# Patient Record
Sex: Female | Born: 1960 | Race: Black or African American | Hispanic: No | Marital: Married | Smoking: Former smoker
Health system: Southern US, Community
[De-identification: ages and names within clinical notes are randomized; demographics above are authoritative.]

## PROBLEM LIST (undated history)

## (undated) DIAGNOSIS — E119 Type 2 diabetes mellitus without complications: Secondary | ICD-10-CM

## (undated) DIAGNOSIS — R0600 Dyspnea, unspecified: Secondary | ICD-10-CM

## (undated) DIAGNOSIS — S86019A Strain of unspecified Achilles tendon, initial encounter: Secondary | ICD-10-CM

## (undated) DIAGNOSIS — J302 Other seasonal allergic rhinitis: Secondary | ICD-10-CM

## (undated) DIAGNOSIS — I10 Essential (primary) hypertension: Secondary | ICD-10-CM

## (undated) DIAGNOSIS — J069 Acute upper respiratory infection, unspecified: Secondary | ICD-10-CM

## (undated) DIAGNOSIS — K219 Gastro-esophageal reflux disease without esophagitis: Secondary | ICD-10-CM

## (undated) HISTORY — DX: Type 2 diabetes mellitus without complications: E11.9

## (undated) HISTORY — PX: WISDOM TOOTH EXTRACTION: SHX21

## (undated) HISTORY — PX: NASAL SEPTUM SURGERY: SHX37

## (undated) HISTORY — PX: FOOT SURGERY: SHX648

## (undated) HISTORY — DX: Acute upper respiratory infection, unspecified: J06.9

---

## 1988-01-09 HISTORY — PX: GANGLION CYST EXCISION: SHX1691

## 1997-06-08 ENCOUNTER — Ambulatory Visit (HOSPITAL_COMMUNITY): Admission: RE | Admit: 1997-06-08 | Discharge: 1997-06-08 | Payer: Self-pay | Admitting: Gastroenterology

## 2002-02-25 ENCOUNTER — Other Ambulatory Visit: Admission: RE | Admit: 2002-02-25 | Discharge: 2002-02-25 | Payer: Self-pay | Admitting: Obstetrics and Gynecology

## 2002-08-20 ENCOUNTER — Other Ambulatory Visit: Admission: RE | Admit: 2002-08-20 | Discharge: 2002-08-20 | Payer: Self-pay | Admitting: Obstetrics and Gynecology

## 2002-09-10 ENCOUNTER — Inpatient Hospital Stay (HOSPITAL_COMMUNITY): Admission: AD | Admit: 2002-09-10 | Discharge: 2002-09-10 | Payer: Self-pay | Admitting: Obstetrics and Gynecology

## 2005-11-07 ENCOUNTER — Encounter: Admission: RE | Admit: 2005-11-07 | Discharge: 2005-11-07 | Payer: Self-pay | Admitting: Obstetrics and Gynecology

## 2006-11-12 ENCOUNTER — Encounter: Admission: RE | Admit: 2006-11-12 | Discharge: 2006-11-12 | Payer: Self-pay | Admitting: Family Medicine

## 2007-05-29 ENCOUNTER — Encounter: Admission: RE | Admit: 2007-05-29 | Discharge: 2007-05-29 | Payer: Self-pay | Admitting: Gastroenterology

## 2008-01-16 ENCOUNTER — Encounter: Admission: RE | Admit: 2008-01-16 | Discharge: 2008-01-16 | Payer: Self-pay | Admitting: Obstetrics and Gynecology

## 2008-01-23 ENCOUNTER — Encounter: Admission: RE | Admit: 2008-01-23 | Discharge: 2008-01-23 | Payer: Self-pay | Admitting: Obstetrics and Gynecology

## 2009-01-25 ENCOUNTER — Encounter: Admission: RE | Admit: 2009-01-25 | Discharge: 2009-01-25 | Payer: Self-pay | Admitting: Family Medicine

## 2010-01-29 ENCOUNTER — Encounter: Payer: Self-pay | Admitting: Family Medicine

## 2010-01-29 ENCOUNTER — Encounter: Payer: Self-pay | Admitting: Gastroenterology

## 2010-01-29 ENCOUNTER — Encounter: Payer: Self-pay | Admitting: Obstetrics and Gynecology

## 2010-01-31 ENCOUNTER — Encounter
Admission: RE | Admit: 2010-01-31 | Discharge: 2010-01-31 | Payer: Self-pay | Source: Home / Self Care | Attending: Family Medicine | Admitting: Family Medicine

## 2011-01-11 ENCOUNTER — Other Ambulatory Visit: Payer: Self-pay | Admitting: Family Medicine

## 2011-01-11 DIAGNOSIS — Z1231 Encounter for screening mammogram for malignant neoplasm of breast: Secondary | ICD-10-CM

## 2011-02-02 ENCOUNTER — Ambulatory Visit
Admission: RE | Admit: 2011-02-02 | Discharge: 2011-02-02 | Disposition: A | Discharge status: Home / Self Care | Payer: 59 | Source: Ambulatory Visit | Attending: Family Medicine | Admitting: Family Medicine

## 2011-02-02 DIAGNOSIS — Z1231 Encounter for screening mammogram for malignant neoplasm of breast: Secondary | ICD-10-CM

## 2011-05-09 DIAGNOSIS — S86019A Strain of unspecified Achilles tendon, initial encounter: Secondary | ICD-10-CM

## 2011-05-09 HISTORY — DX: Strain of unspecified achilles tendon, initial encounter: S86.019A

## 2011-05-21 ENCOUNTER — Other Ambulatory Visit: Payer: Self-pay | Admitting: Orthopedic Surgery

## 2011-05-21 ENCOUNTER — Encounter (HOSPITAL_BASED_OUTPATIENT_CLINIC_OR_DEPARTMENT_OTHER): Payer: Self-pay | Admitting: *Deleted

## 2011-05-23 ENCOUNTER — Encounter (HOSPITAL_BASED_OUTPATIENT_CLINIC_OR_DEPARTMENT_OTHER): Payer: Self-pay | Admitting: Anesthesiology

## 2011-05-23 ENCOUNTER — Encounter (HOSPITAL_BASED_OUTPATIENT_CLINIC_OR_DEPARTMENT_OTHER)
Admission: RE | Disposition: A | Discharge status: Home / Self Care | Payer: Self-pay | Source: Ambulatory Visit | Attending: Orthopedic Surgery

## 2011-05-23 ENCOUNTER — Encounter (HOSPITAL_BASED_OUTPATIENT_CLINIC_OR_DEPARTMENT_OTHER): Payer: Self-pay | Admitting: *Deleted

## 2011-05-23 ENCOUNTER — Ambulatory Visit (HOSPITAL_BASED_OUTPATIENT_CLINIC_OR_DEPARTMENT_OTHER): Payer: BC Managed Care – PPO | Admitting: Anesthesiology

## 2011-05-23 ENCOUNTER — Ambulatory Visit (HOSPITAL_BASED_OUTPATIENT_CLINIC_OR_DEPARTMENT_OTHER)
Admission: RE | Admit: 2011-05-23 | Discharge: 2011-05-23 | Disposition: A | Discharge status: Home / Self Care | Payer: BC Managed Care – PPO | Source: Ambulatory Visit | Attending: Orthopedic Surgery | Admitting: Orthopedic Surgery

## 2011-05-23 DIAGNOSIS — S93499A Sprain of other ligament of unspecified ankle, initial encounter: Secondary | ICD-10-CM | POA: Insufficient documentation

## 2011-05-23 DIAGNOSIS — W010XXA Fall on same level from slipping, tripping and stumbling without subsequent striking against object, initial encounter: Secondary | ICD-10-CM | POA: Insufficient documentation

## 2011-05-23 DIAGNOSIS — K219 Gastro-esophageal reflux disease without esophagitis: Secondary | ICD-10-CM | POA: Insufficient documentation

## 2011-05-23 HISTORY — DX: Gastro-esophageal reflux disease without esophagitis: K21.9

## 2011-05-23 HISTORY — PX: ACHILLES TENDON SURGERY: SHX542

## 2011-05-23 HISTORY — DX: Other seasonal allergic rhinitis: J30.2

## 2011-05-23 HISTORY — DX: Strain of unspecified achilles tendon, initial encounter: S86.019A

## 2011-05-23 SURGERY — REPAIR, TENDON, ACHILLES
Anesthesia: General | Site: Ankle | Laterality: Left | Wound class: Clean

## 2011-05-23 MED ORDER — MIDAZOLAM HCL 5 MG/5ML IJ SOLN
INTRAMUSCULAR | Status: DC | PRN
  Administered 2011-05-23: 2 mg via INTRAVENOUS

## 2011-05-23 MED ORDER — MORPHINE SULFATE 4 MG/ML IJ SOLN: 0.0500 mg/kg | INTRAMUSCULAR | Status: DC | PRN

## 2011-05-23 MED ORDER — POVIDONE-IODINE 7.5 % EX SOLN: Freq: Once | CUTANEOUS | Status: DC

## 2011-05-23 MED ORDER — PHENYLEPHRINE HCL 10 MG/ML IJ SOLN
INTRAMUSCULAR | Status: DC | PRN
  Administered 2011-05-23: 40 ug via INTRAVENOUS

## 2011-05-23 MED ORDER — CEFAZOLIN SODIUM-DEXTROSE 2-3 GM-% IV SOLR
2.0000 g | INTRAVENOUS | Status: AC
  Administered 2011-05-23: 2 g via INTRAVENOUS

## 2011-05-23 MED ORDER — SUCCINYLCHOLINE CHLORIDE 20 MG/ML IJ SOLN
INTRAMUSCULAR | Status: DC | PRN
  Administered 2011-05-23: 100 mg via INTRAVENOUS

## 2011-05-23 MED ORDER — HYDROMORPHONE HCL PF 1 MG/ML IJ SOLN: 0.2500 mg | INTRAMUSCULAR | Status: DC | PRN

## 2011-05-23 MED ORDER — BUPIVACAINE HCL (PF) 0.5 % IJ SOLN
INTRAMUSCULAR | Status: DC | PRN
  Administered 2011-05-23: 20 mL

## 2011-05-23 MED ORDER — ONDANSETRON HCL 4 MG/2ML IJ SOLN: 4.0000 mg | Freq: Once | INTRAMUSCULAR | Status: DC | PRN

## 2011-05-23 MED ORDER — ACETAMINOPHEN 10 MG/ML IV SOLN
INTRAVENOUS | Status: DC | PRN
  Administered 2011-05-23: 1000 mg via INTRAVENOUS

## 2011-05-23 MED ORDER — PROPOFOL 10 MG/ML IV EMUL
INTRAVENOUS | Status: DC | PRN
  Administered 2011-05-23: 150 mg via INTRAVENOUS

## 2011-05-23 MED ORDER — LIDOCAINE HCL (CARDIAC) 20 MG/ML IV SOLN
INTRAVENOUS | Status: DC | PRN
  Administered 2011-05-23: 100 mg via INTRAVENOUS

## 2011-05-23 MED ORDER — CEFAZOLIN SODIUM 1-5 GM-% IV SOLN
1.0000 g | Freq: Once | INTRAVENOUS | Status: AC
  Administered 2011-05-23: 1 g via INTRAVENOUS

## 2011-05-23 MED ORDER — FENTANYL CITRATE 0.05 MG/ML IJ SOLN
INTRAMUSCULAR | Status: DC | PRN
  Administered 2011-05-23: 100 ug via INTRAVENOUS
  Administered 2011-05-23: 50 ug via INTRAVENOUS

## 2011-05-23 MED ORDER — LACTATED RINGERS IV SOLN
INTRAVENOUS | Status: DC
  Administered 2011-05-23: 09:00:00 via INTRAVENOUS

## 2011-05-23 SURGICAL SUPPLY — 68 items
BANDAGE ELASTIC 4 VELCRO ST LF (GAUZE/BANDAGES/DRESSINGS) ×2 IMPLANT
BANDAGE ELASTIC 6 VELCRO ST LF (GAUZE/BANDAGES/DRESSINGS) ×2 IMPLANT
BANDAGE ESMARK 6X9 LF (GAUZE/BANDAGES/DRESSINGS) ×1 IMPLANT
BENZOIN TINCTURE PRP APPL 2/3 (GAUZE/BANDAGES/DRESSINGS) ×2 IMPLANT
BLADE SURG 10 STRL SS (BLADE) ×2 IMPLANT
BLADE SURG 15 STRL LF DISP TIS (BLADE) ×1 IMPLANT
BLADE SURG 15 STRL SS (BLADE) ×1
BNDG ESMARK 4X9 LF (GAUZE/BANDAGES/DRESSINGS) IMPLANT
BNDG ESMARK 6X9 LF (GAUZE/BANDAGES/DRESSINGS) ×2
CLOTH BEACON ORANGE TIMEOUT ST (SAFETY) ×2 IMPLANT
COVER TABLE BACK 60X90 (DRAPES) ×2 IMPLANT
DECANTER SPIKE VIAL GLASS SM (MISCELLANEOUS) IMPLANT
DRAPE EXTREMITY T 121X128X90 (DRAPE) ×2 IMPLANT
DRAPE U 20/CS (DRAPES) ×2 IMPLANT
DRAPE U-SHAPE 47X51 STRL (DRAPES) ×2 IMPLANT
DRSG EMULSION OIL 3X3 NADH (GAUZE/BANDAGES/DRESSINGS) ×2 IMPLANT
DURAPREP 26ML APPLICATOR (WOUND CARE) ×2 IMPLANT
ELECT REM PT RETURN 9FT ADLT (ELECTROSURGICAL) ×2
ELECTRODE REM PT RTRN 9FT ADLT (ELECTROSURGICAL) ×1 IMPLANT
GAUZE XEROFORM 1X8 LF (GAUZE/BANDAGES/DRESSINGS) ×2 IMPLANT
GLOVE BIO SURGEON STRL SZ 6.5 (GLOVE) ×2 IMPLANT
GLOVE BIOGEL PI IND STRL 8 (GLOVE) ×2 IMPLANT
GLOVE BIOGEL PI INDICATOR 8 (GLOVE) ×2
GLOVE ECLIPSE 7.5 STRL STRAW (GLOVE) ×4 IMPLANT
GLOVE INDICATOR 7.0 STRL GRN (GLOVE) ×2 IMPLANT
GOWN BRE IMP PREV XXLGXLNG (GOWN DISPOSABLE) ×2 IMPLANT
GOWN PREVENTION PLUS XLARGE (GOWN DISPOSABLE) ×2 IMPLANT
GOWN PREVENTION PLUS XXLARGE (GOWN DISPOSABLE) ×2 IMPLANT
NDL SUT 6 .5 CRC .975X.05 MAYO (NEEDLE) ×1 IMPLANT
NEEDLE 22X1 1/2 (OR ONLY) (NEEDLE) ×2 IMPLANT
NEEDLE HYPO 25X1 1.5 SAFETY (NEEDLE) IMPLANT
NEEDLE MAYO TAPER (NEEDLE) ×2
NS IRRIG 1000ML POUR BTL (IV SOLUTION) ×2 IMPLANT
PAD CAST 4YDX4 CTTN HI CHSV (CAST SUPPLIES) ×2 IMPLANT
PADDING CAST ABS 4INX4YD NS (CAST SUPPLIES) ×1
PADDING CAST ABS COTTON 4X4 ST (CAST SUPPLIES) ×1 IMPLANT
PADDING CAST COTTON 4X4 STRL (CAST SUPPLIES) ×2
PASSER SUT SWANSON 36MM LOOP (INSTRUMENTS) IMPLANT
PENCIL BUTTON HOLSTER BLD 10FT (ELECTRODE) ×2 IMPLANT
SPLINT FAST PLASTER 5X30 (CAST SUPPLIES) ×2
SPLINT PLASTER CAST FAST 5X30 (CAST SUPPLIES) ×2 IMPLANT
SPONGE GAUZE 4X4 12PLY (GAUZE/BANDAGES/DRESSINGS) ×2 IMPLANT
SPONGE LAP 4X18 X RAY DECT (DISPOSABLE) ×2 IMPLANT
STOCKINETTE 6  STRL (DRAPES) ×1
STOCKINETTE 6 STRL (DRAPES) ×1 IMPLANT
SUCTION FRAZIER TIP 10 FR DISP (SUCTIONS) IMPLANT
SUT ETHIBOND 0 MO6 C/R (SUTURE) IMPLANT
SUT ETHIBOND 2 OS 4 DA (SUTURE) IMPLANT
SUT ETHILON 3 0 PS 1 (SUTURE) ×2 IMPLANT
SUT FIBERWIRE #2 38 T-5 BLUE (SUTURE) ×4
SUT FIBERWIRE #5 38 CONV NDL (SUTURE)
SUT MNCRL AB 3-0 PS2 18 (SUTURE) ×2 IMPLANT
SUT VIC AB 2-0 CT1 27 (SUTURE) ×2
SUT VIC AB 2-0 CT1 TAPERPNT 27 (SUTURE) ×1 IMPLANT
SUT VIC AB 3-0 FS2 27 (SUTURE) IMPLANT
SUT VIC AB 4-0 SH 27 (SUTURE) ×1
SUT VIC AB 4-0 SH 27XANBCTRL (SUTURE) ×1 IMPLANT
SUTURE FIBERWR #2 38 T-5 BLUE (SUTURE) ×2 IMPLANT
SUTURE FIBERWR #5 38 CONV NDL (SUTURE) IMPLANT
SYR BULB 3OZ (MISCELLANEOUS) ×2 IMPLANT
SYR CONTROL 10ML LL (SYRINGE) ×2 IMPLANT
TAPE STRIPS DRAPE STRL (GAUZE/BANDAGES/DRESSINGS) ×2 IMPLANT
TOWEL OR 17X24 6PK STRL BLUE (TOWEL DISPOSABLE) ×4 IMPLANT
TOWEL OR NON WOVEN STRL DISP B (DISPOSABLE) ×2 IMPLANT
TUBE CONNECTING 20X1/4 (TUBING) ×2 IMPLANT
UNDERPAD 30X30 INCONTINENT (UNDERPADS AND DIAPERS) ×2 IMPLANT
WATER STERILE IRR 1000ML POUR (IV SOLUTION) IMPLANT
YANKAUER SUCT BULB TIP NO VENT (SUCTIONS) ×2 IMPLANT

## 2011-05-23 NOTE — Anesthesia Preprocedure Evaluation (Addendum)
Anesthesia Evaluation  Patient identified by MRN, date of birth, ID band Patient awake    Reviewed: Allergy & Precautions, H&P , NPO status , Patient's Chart, lab work & pertinent test results  Airway Mallampati: I TM Distance: >3 FB Neck ROM: Full    Dental   Pulmonary          Cardiovascular     Neuro/Psych  Neuromuscular disease    GI/Hepatic GERD-  Medicated and Controlled,  Endo/Other    Renal/GU      Musculoskeletal   Abdominal   Peds  Hematology   Anesthesia Other Findings   Reproductive/Obstetrics                           Anesthesia Physical Anesthesia Plan  ASA: II  Anesthesia Plan: General   Post-op Pain Management:    Induction: Intravenous  Airway Management Planned: Oral ETT  Additional Equipment:   Intra-op Plan:   Post-operative Plan: Extubation in OR  Informed Consent: I have reviewed the patients History and Physical, chart, labs and discussed the procedure including the risks, benefits and alternatives for the proposed anesthesia with the patient or authorized representative who has indicated his/her understanding and acceptance.     Plan Discussed with: CRNA and Surgeon  Anesthesia Plan Comments:        Anesthesia Quick Evaluation

## 2011-05-23 NOTE — Brief Op Note (Signed)
05/23/2011  3:51 PM  PATIENT:  Shelley Anderson  51 y.o. female  PRE-OPERATIVE DIAGNOSIS:  achilles tendon rupture on left  POST-OPERATIVE DIAGNOSIS:  achilles tendon rupture on left  PROCEDURE:  Procedure(s) (LRB): ACHILLES TENDON REPAIR (Left)  SURGEON:  Surgeon(s) and Role:    * Harvie Junior, MD - Primary  PHYSICIAN ASSISTANT:   ASSISTANTS: bethune   ANESTHESIA:   general  EBL:  Total I/O In: 462 [P.O.:462] Out: -   BLOOD ADMINISTERED:none  DRAINS: none   LOCAL MEDICATIONS USED:  MARCAINE     SPECIMEN:  No Specimen  DISPOSITION OF SPECIMEN:  N/A  COUNTS:  YES  TOURNIQUET:   Total Tourniquet Time Documented: Thigh (Left) - 56 minutes  DICTATION: .Other Dictation: Dictation Number [id number]  PLAN OF CARE: Discharge to home after PACU  PATIENT DISPOSITION:  PACU - hemodynamically stable.   Delay start of Pharmacological VTE agent (>24hrs) due to surgical blood loss or risk of bleeding: not applicable

## 2011-05-23 NOTE — Anesthesia Procedure Notes (Signed)
Procedure Name: Intubation Date/Time: 05/23/2011 10:14 AM Performed by: Zenia Resides D Pre-anesthesia Checklist: Patient identified, Emergency Drugs available, Suction available, Patient being monitored and Timeout performed Patient Re-evaluated:Patient Re-evaluated prior to inductionOxygen Delivery Method: Circle System Utilized Preoxygenation: Pre-oxygenation with 100% oxygen Intubation Type: IV induction Ventilation: Mask ventilation without difficulty Grade View: Grade II Tube type: Oral Tube size: 7.0 mm Number of attempts: 1 Airway Equipment and Method: stylet and oral airway Placement Confirmation: ETT inserted through vocal cords under direct vision,  positive ETCO2 and breath sounds checked- equal and bilateral Secured at: 23 cm Tube secured with: Tape Dental Injury: Teeth and Oropharynx as per pre-operative assessment

## 2011-05-23 NOTE — Transfer of Care (Signed)
Immediate Anesthesia Transfer of Care Note  Patient: Shelley Anderson  Procedure(s) Performed: Procedure(s) (LRB): ACHILLES TENDON REPAIR (Left)  Patient Location: PACU  Anesthesia Type: General and Regional  Level of Consciousness: awake, alert  and oriented  Airway & Oxygen Therapy: Patient Spontanous Breathing and Patient connected to face mask oxygen  Post-op Assessment: Report given to PACU RN and Post -op Vital signs reviewed and stable  Post vital signs: Reviewed and stable  Complications: No apparent anesthesia complications

## 2011-05-23 NOTE — H&P (Signed)
PREOPERATIVE H&P  Chief Complaint: l achilles rupture  HPI: Shelley Anderson is a 51 y.o. female who presents for evaluation of l achilles rupture. It has been present for 10 days and has been worsening. She has failed conservative measures. Pain is rated as moderate.  Past Medical History  Diagnosis Date  . Acid reflux     OTC as needed  . Achilles tendon rupture 05/2011    left  . Seasonal allergies    Past Surgical History  Procedure Date  . Nasal septum surgery > 20 yrs. ago  . Foot surgery 20 yrs. ago    to remove bone spurs left foot  . Ganglion cyst excision 1990    right wrist   History   Social History  . Marital Status: Married    Spouse Name: N/A    Number of Children: N/A  . Years of Education: N/A   Social History Main Topics  . Smoking status: Never Smoker   . Smokeless tobacco: Never Used  . Alcohol Use: Yes     rarely  . Drug Use: No  . Sexually Active:    Other Topics Concern  . None   Social History Narrative  . None   History reviewed. No pertinent family history. Allergies  Allergen Reactions  . Aspirin Nausea And Vomiting  . Lactose Intolerance (Gi) Other (See Comments)    GI UPSET   Prior to Admission medications   Medication Sig Start Date End Date Taking? Authorizing Provider  azelastine (ASTELIN) 137 MCG/SPRAY nasal spray Place 1 spray into the nose 2 (two) times daily. Use in each nostril as directed   Yes Historical Provider, MD  calcium citrate-vitamin D (CITRACAL+D) 315-200 MG-UNIT per tablet Take 1 tablet by mouth 2 (two) times daily.   Yes Historical Provider, MD  famotidine (PEPCID AC) 10 MG chewable tablet Chew 10 mg by mouth as needed.    Yes Historical Provider, MD  fexofenadine-pseudoephedrine (ALLEGRA-D 24) 180-240 MG per 24 hr tablet Take 1 tablet by mouth daily.   Yes Historical Provider, MD  Multiple Vitamin (MULTIVITAMIN) tablet Take 1 tablet by mouth daily.   Yes Historical Provider, MD  vitamin C (ASCORBIC ACID) 500 MG  tablet Take 500 mg by mouth daily.   Yes Historical Provider, MD     Positive ROS: none  All other systems have been reviewed and were otherwise negative with the exception of those mentioned in the HPI and as above.  Physical Exam: There were no vitals filed for this visit.  General: Alert, no acute distress Cardiovascular: No pedal edema Respiratory: No cyanosis, no use of accessory musculature GI: No organomegaly, abdomen is soft and non-tender Skin: No lesions in the area of chief complaint Neurologic: Sensation intact distally Psychiatric: Patient is competent for consent with normal mood and affect Lymphatic: No axillary or cervical lymphadenopathy  MUSCULOSKELETAL: l achilles -Thompson testy.  Palp defect in achilles  Assessment/Plan: achilles tendon rupture on left Plan for Procedure(s): ACHILLES TENDON REPAIR  The risks benefits and alternatives were discussed with the patient including but not limited to the risks of nonoperative treatment, versus surgical intervention including infection, bleeding, nerve injury, malunion, nonunion, hardware prominence, hardware failure, need for hardware removal, blood clots, cardiopulmonary complications, morbidity, mortality, among others, and they were willing to proceed.  Predicted outcome is good, although there will be at least a six to nine month expected recovery.  Luken Shadowens L, MD 05/23/2011 8:40 AM

## 2011-05-23 NOTE — Addendum Note (Signed)
Addendum  created 05/23/11 1404 by Johncarlos Holtsclaw D Rayvn Rickerson, CRNA   Modules edited:Anesthesia Events    

## 2011-05-23 NOTE — Discharge Instructions (Signed)

## 2011-05-23 NOTE — Progress Notes (Signed)
Pt. Discussed block with Dr. Michelle Piper and Dr. Luiz Blare and has decided not to have the block.

## 2011-05-23 NOTE — Addendum Note (Signed)
Addendum  created 05/23/11 1404 by Ronnette Hila, CRNA   Modules edited:Anesthesia Events

## 2011-05-23 NOTE — Anesthesia Postprocedure Evaluation (Signed)
Anesthesia Post Note  Patient: Office manager  Procedure(s) Performed: Procedure(s) (LRB): ACHILLES TENDON REPAIR (Left)  Anesthesia type: general  Patient location: PACU  Post pain: Pain level controlled  Post assessment: Patient's Cardiovascular Status Stable  Last Vitals:  Filed Vitals:   05/23/11 1200  BP: 128/79  Pulse:   Temp:   Resp:     Post vital signs: Reviewed and stable  Level of consciousness: sedated  Complications: No apparent anesthesia complications

## 2011-05-23 NOTE — Anesthesia Postprocedure Evaluation (Signed)
Anesthesia Post Note  Patient: Office manager  Procedure(s) Performed: Procedure(s) (LRB): ACHILLES TENDON REPAIR (Left)  Anesthesia type: general  Patient location: PACU  Post pain: Pain level controlled  Post assessment: Patient's Cardiovascular Status Stable  Last Vitals:  Filed Vitals:   05/23/11 1236  BP: 140/63  Pulse: 94  Temp:   Resp: 15    Post vital signs: Reviewed and stable  Level of consciousness: sedated  Complications: No apparent anesthesia complications

## 2011-05-24 NOTE — Op Note (Signed)
[name], DUNK NO.:  [id number]  MEDICAL RECORD NO.:  [id number]  LOCATION:                                 FACILITY:  PHYSICIAN:  Harvie Junior, M.D.        DATE OF BIRTH:  DATE OF PROCEDURE:  05/23/2011 DATE OF DISCHARGE:                              OPERATIVE REPORT   PREOPERATIVE DIAGNOSIS:  Achilles tendon rupture, left.  POSTOPERATIVE DIAGNOSIS:  Achilles tendon rupture, left.  PRINCIPAL PROCEDURE:  Left Achilles tendon repair with #2 FiberWire.  SURGEON:  Harvie Junior, MD  ASSISTANT:  Marshia Ly, PA  ANESTHESIA:  General.  BRIEF HISTORY:  Mrs. Shelley Anderson is a 51 year old female who had a slip and fall and basically ruptured Achilles tendon.  We evaluated and noted to have a negative Thompson test and the palpable defect.  A ultrasound was done in the office, which showed her tear and she was brought to the operating room for fixation.  PROCEDURE:  The patient was brought to the operating room.  After adequate anesthesia was induced with general anesthetic, the patient was placed supine and then rolled prone on the operating table.  Chest rolls were placed and all bony prominences were well padded.  Attention was then turned to the left leg where after routine prep and drape, the leg was exsanguinated, blood pressure tourniquet was inflated to 300 mmHg and following this, a medial incision was made, subcutaneous tissue down to the level of the tendon and the peritenon.  The peritenon was divided.  The tendon was then dissected free from the peritenon and then a Krackow stitch was passed along the proximal tendon border, Krackow stitch passed along the distal tendon border, this gave beautiful access to the tendon and got great pull on each side.  Set the stitches, it is very tight as tight as we could and then we tied them with these #2 FiberWire stitches, having brought the stitches out the bottom, so that the stitches were buried.   Once this was done, the tendon was tested, positive Janee Morn test is now normal.  The attention was then turned towards the peritenon closure after irrigating and suctioning dry the wound.  The peritenon was closed with 4-0 Vicryl running, the skin with 4-0 Vicryl and 3-0 nylon stitches.  A sterile compressive dressing was applied as well as a toes down plantar splint and the patient was taken to recovery room, she was noted to be in satisfactory condition.  Estimated blood loss for the procedure was none.     Harvie Junior, M.D.     Ranae Plumber  D:  05/23/2011  T:  05/24/2011  Job:  [id number]

## 2011-05-25 ENCOUNTER — Encounter (HOSPITAL_BASED_OUTPATIENT_CLINIC_OR_DEPARTMENT_OTHER): Payer: Self-pay | Admitting: Orthopedic Surgery

## 2012-01-08 ENCOUNTER — Other Ambulatory Visit: Payer: Self-pay | Admitting: Family Medicine

## 2012-01-08 DIAGNOSIS — Z1231 Encounter for screening mammogram for malignant neoplasm of breast: Secondary | ICD-10-CM

## 2012-02-14 ENCOUNTER — Ambulatory Visit
Admission: RE | Admit: 2012-02-14 | Discharge: 2012-02-14 | Disposition: A | Discharge status: Home / Self Care | Payer: BC Managed Care – PPO | Source: Ambulatory Visit | Attending: Family Medicine | Admitting: Family Medicine

## 2012-02-14 DIAGNOSIS — Z1231 Encounter for screening mammogram for malignant neoplasm of breast: Secondary | ICD-10-CM

## 2013-01-13 ENCOUNTER — Other Ambulatory Visit: Payer: Self-pay

## 2013-01-13 DIAGNOSIS — Z1231 Encounter for screening mammogram for malignant neoplasm of breast: Secondary | ICD-10-CM

## 2013-01-16 ENCOUNTER — Encounter: Payer: Self-pay | Admitting: Podiatrist

## 2013-01-16 ENCOUNTER — Ambulatory Visit (INDEPENDENT_AMBULATORY_CARE_PROVIDER_SITE_OTHER): Payer: 59 | Admitting: Podiatrist

## 2013-01-16 VITALS — BP 129/53 | HR 86 | Resp 18

## 2013-01-16 DIAGNOSIS — M216X9 Other acquired deformities of unspecified foot: Secondary | ICD-10-CM

## 2013-01-16 DIAGNOSIS — Q828 Other specified congenital malformations of skin: Secondary | ICD-10-CM

## 2013-01-16 NOTE — Progress Notes (Signed)
   Subjective:    Patient ID: Shelley Anderson, female    DOB: 1960/10/16, 53 y.o.   MRN: [id number]  HPI I need my calluses trimmed up on the ball of both feet and I am a diabetic    Review of Systems  Constitutional: Negative.   HENT: Negative.   Eyes: Negative.   Respiratory: Negative.   Cardiovascular: Negative.   Gastrointestinal: Negative.   Endocrine: Negative.   Genitourinary: Negative.   Musculoskeletal: Positive for back pain.  Skin: Negative.   Allergic/Immunologic:       Dairy   Neurological: Negative.   Hematological: Bruises/bleeds easily.  Psychiatric/Behavioral: Negative.        Objective:   Physical Exam GENERAL APPEARANCE: Alert, conversant. Appropriately groomed. No acute distress.  VASCULAR: Pedal pulses palpable and strong bilateral.  Capillary refill time is immediate to all digits,  Proximal to distal cooling it warm to warm.  Digital hair growth is present bilateral  NEUROLOGIC: sensation is intact epicritically and protectively to 5.07 monofilament at 5/5 sites bilateral.  Light touch is intact bilateral, vibratory sensation intact bilateral,  MUSCULOSKELETAL: acceptable muscle strength, tone and stability bilateral. Forefoot cavus present bilateral DERMATOLOGIC:caluses present plantar forefoot bilateral.  They are diffuse in nature and integument is intact.      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 Recommended soft supportive shoes.

## 2013-02-17 ENCOUNTER — Ambulatory Visit: Payer: BC Managed Care – PPO

## 2013-02-26 ENCOUNTER — Ambulatory Visit
Admission: RE | Admit: 2013-02-26 | Discharge: 2013-02-26 | Disposition: A | Discharge status: Home / Self Care | Payer: 59 | Source: Ambulatory Visit

## 2013-02-26 DIAGNOSIS — Z1231 Encounter for screening mammogram for malignant neoplasm of breast: Secondary | ICD-10-CM

## 2013-03-03 ENCOUNTER — Ambulatory Visit: Payer: BC Managed Care – PPO

## 2013-10-04 ENCOUNTER — Encounter: Payer: Self-pay | Admitting: *Deleted

## 2014-02-05 ENCOUNTER — Other Ambulatory Visit: Payer: Self-pay

## 2014-02-05 DIAGNOSIS — Z1231 Encounter for screening mammogram for malignant neoplasm of breast: Secondary | ICD-10-CM

## 2014-03-08 ENCOUNTER — Other Ambulatory Visit: Payer: Self-pay

## 2014-03-08 ENCOUNTER — Ambulatory Visit
Admission: RE | Admit: 2014-03-08 | Discharge: 2014-03-08 | Disposition: A | Discharge status: Home / Self Care | Payer: BC Managed Care – PPO | Source: Ambulatory Visit

## 2014-03-08 ENCOUNTER — Encounter (INDEPENDENT_AMBULATORY_CARE_PROVIDER_SITE_OTHER): Payer: Self-pay

## 2014-03-08 DIAGNOSIS — Z1231 Encounter for screening mammogram for malignant neoplasm of breast: Secondary | ICD-10-CM

## 2014-09-26 NOTE — Progress Notes (Signed)
Cardiology Office Note   Date:  09/27/2014   ID:  Shelley Anderson, DOB 12-Dec-1960, MRN [id number]  PCP:  Geraldo Pitter, MD  Cardiologist:   Madilyn Hook, MD   Chief Complaint  Patient presents with  . New Evaluation    pt states she has not had chest pain in weeks/ has hadi chest discomfort in the past 10 days  . Dizziness    pt thinks from allergies/in the last 4 weeks, when she sits up she gets dizzy      History of Present Illness: Shelley Anderson is a 54 y.o. female who presents for an evaluation of chest pain.  One month ago she had a 4-5 day episode of sharp pain under her L breast and L jaw pain.  The pain was intermittent and took her breath away.  It was non-exertional and not associated with nausea, shortness of breath, or diaphoresis. It improved after passing gas and was better with movement. It did not radiate. She took simethicone, which helped her symptoms. She felt that it might have been due to gas. Since that time, she has experienced one additional episode. Shelley Anderson exercises 4 times per week with cardio and weight lifting. She denies any chest pain or shortness of breath with this activity.  Shelley Anderson recently gained 35 pounds after losing both of her parents over the span of 8 months. As above, she has increased her exercise. She is also tried to improve her diet. She eats lots of vegetables and gr. She does not eat many white foods. Her main trouble is portion control. She has been watching her blood pressure and it has been slightly elevated when visiting her primary care physician. They have had an ongoing discussion about starting an antihypertensive though she has requested to wait as she knows her blood pressure will go down when she loses weight.    Past Medical History  Diagnosis Date  . Acid reflux     OTC as needed  . Achilles tendon rupture 05/2011    left  . Seasonal allergies   . Diabetes mellitus without complication     Past Surgical  History  Procedure Laterality Date  . Nasal septum surgery  > 20 yrs. ago  . Foot surgery  20 yrs. ago    to remove bone spurs left foot  . Ganglion cyst excision  1990    right wrist  . Achilles tendon surgery  05/23/2011    Procedure: ACHILLES TENDON REPAIR;  Surgeon: Harvie Junior, MD;  Location: [facility];  Service: Orthopedics;  Laterality: Left;     Current Outpatient Prescriptions  Medication Sig Dispense Refill  . budesonide-formoterol (SYMBICORT) 160-4.5 MCG/ACT inhaler Inhale 2 puffs into the lungs 2 (two) times daily as needed.    . calcium citrate-vitamin D (CITRACAL+D) 315-200 MG-UNIT per tablet Take 1 tablet by mouth 2 (two) times daily.    . famotidine (PEPCID AC) 10 MG chewable tablet Chew 10 mg by mouth as needed.     . fexofenadine-pseudoephedrine (ALLEGRA-D 24) 180-240 MG per 24 hr tablet Take 1 tablet by mouth daily.    . fluticasone (FLONASE) 50 MCG/ACT nasal spray Place 2 sprays into both nostrils daily.    . Multiple Vitamin (MULTIVITAMIN) tablet Take 1 tablet by mouth daily.    Marland Kitchen omeprazole (PRILOSEC) 40 MG capsule Take 40 mg by mouth daily.  12  . vitamin C (ASCORBIC ACID) 500 MG tablet Take 500 mg by mouth  daily.    . amLODipine (NORVASC) 2.5 MG tablet Take 1 tablet (2.5 mg total) by mouth daily. 90 tablet 3   No current facility-administered medications for this visit.    Allergies:   Aspirin and Lactose intolerance (gi)    Social History:  The patient  reports that she has never smoked. She has never used smokeless tobacco. She reports that she drinks alcohol. She reports that she does not use illicit drugs.   Family History:  The patient's family history includes Diabetes in her father; Kidney disease in her father; Muscular dystrophy in her mother.    ROS:  Please see the history of present illness.   Otherwise, review of systems are positive for none.   All other systems are reviewed and negative.    PHYSICAL EXAM: VS: BP 150/84 L,  R BP 132/90 mmHg  Pulse 99  Ht 5' 6.75" (1.695 m)  Wt 101.515 kg (223 lb 12.8 oz)  BMI 35.33 kg/m2 , BMI Body mass index is 35.33 kg/(m^2). GENERAL:  Well appearing HEENT:  Pupils equal round and reactive, fundi not visualized, oral mucosa unremarkable NECK:  No jugular venous distention, waveform within normal limits, carotid upstroke brisk and symmetric, no bruits, no thyromegaly LYMPHATICS:  No cervical adenopathy LUNGS:  Clear to auscultation bilaterally HEART:  RRR.  PMI not displaced or sustained,S1 and S2 within normal limits, no S3, no S4, no clicks, no rubs, no murmurs ABD:  Flat, positive bowel sounds normal in frequency in pitch, no bruits, no rebound, no guarding, no midline pulsatile mass, no hepatomegaly, no splenomegaly EXT:  2 plus pulses throughout, no edema, no cyanosis no clubbing SKIN:  No rashes no nodules NEURO:  Cranial nerves II through XII grossly intact, motor grossly intact throughout PSYCH:  Cognitively intact, oriented to person place and time    EKG:  EKG is ordered today. The ekg ordered today demonstrates sinus rhythm at 99 bpm.     Recent Labs: No results found for requested labs within last 365 days.    Lipid Panel No results found for: CHOL, TRIG, HDL, CHOLHDL, VLDL, LDLCALC, LDLDIRECT    Wt Readings from Last 3 Encounters:  09/27/14 101.515 kg (223 lb 12.8 oz)  05/21/11 97.523 kg (215 lb)      Other studies Reviewed: Additional studies/ records that were reviewed today include: . Review of the above records demonstrates:  Please see elsewhere in the note.     ASSESSMENT AND PLAN:  # Atypical chest pain: Symptoms are likely related to to gas. I am reassured that they have not recurred with exercise. However she does have risk factors including obesity, hypertension, and perimenopausal state. Therefore, we will obtain a treadmill exercise stress test to evaluate for ischemia with exertion.   # Hypertension: Blood pressure was 150/84 in  the office today and she reports a history of borderline elevated blood pressures. She has agreed to start amlodipine 2.5 mg by mouth daily, as she wanted to try something with the least side effects possible. We discussed hydrochlorothiazide, but she preferred not to use a diuretic if possible. She was encouraged that should she continue on her weight loss journey, she likely will not need to continue his medication.  She will follow-up with Dr. Parke Simmers for titration of her antihypertensive.  # Obesity: Shelley Anderson was encouraged to keep up her excellent exercise regimen. She also has plans to cut back on her portions. Overall her diet is fairly healthy.   Current medicines are  reviewed at length with the patient today.  The patient does not have concerns regarding medicines.  The following changes have been made:  Start amlodipine 2.5mg  daily  Labs/ tests ordered today include:  Orders Placed This Encounter  Procedures  . Exercise Tolerance Test  . EKG 12-Lead     Disposition:   FU with Dr. Elmarie Shiley C. Duke Salvia as needed.    Signed, Madilyn Hook, MD  09/27/2014 11:05 AM     [facility]

## 2014-09-27 ENCOUNTER — Ambulatory Visit (INDEPENDENT_AMBULATORY_CARE_PROVIDER_SITE_OTHER): Payer: BC Managed Care – PPO | Admitting: Cardiovascular Disease

## 2014-09-27 ENCOUNTER — Encounter: Payer: Self-pay | Admitting: Cardiovascular Disease

## 2014-09-27 VITALS — BP 132/90 | HR 99 | Ht 66.75 in | Wt 223.8 lb

## 2014-09-27 DIAGNOSIS — R079 Chest pain, unspecified: Secondary | ICD-10-CM

## 2014-09-27 DIAGNOSIS — I10 Essential (primary) hypertension: Secondary | ICD-10-CM

## 2014-09-27 MED ORDER — AMLODIPINE BESYLATE 2.5 MG PO TABS: 2.5000 mg | ORAL_TABLET | Freq: Every day | ORAL | Status: DC

## 2014-09-27 NOTE — Patient Instructions (Addendum)
Dr Duke Salvia has recommended making the following medication changes: START Amlodipine 2.5 mg - take 1 tablet by mouth daily. A new prescription has been sent to your pharmacy, [facility], electronically.  Your physician has requested that you have an exercise tolerance test. For further information please visit [email or web address] Please also follow instruction sheet, as given.  Dr Duke Salvia recommends that you follow-up with her as needed.

## 2014-09-28 ENCOUNTER — Encounter: Payer: Self-pay | Admitting: Cardiovascular Disease

## 2014-12-13 ENCOUNTER — Ambulatory Visit (INDEPENDENT_AMBULATORY_CARE_PROVIDER_SITE_OTHER): Payer: BC Managed Care – PPO

## 2014-12-13 ENCOUNTER — Encounter: Payer: Self-pay | Admitting: Podiatry

## 2014-12-13 ENCOUNTER — Ambulatory Visit (INDEPENDENT_AMBULATORY_CARE_PROVIDER_SITE_OTHER): Payer: BC Managed Care – PPO | Admitting: Podiatry

## 2014-12-13 VITALS — BP 106/71 | HR 99 | Resp 16

## 2014-12-13 DIAGNOSIS — M722 Plantar fascial fibromatosis: Secondary | ICD-10-CM | POA: Diagnosis not present

## 2014-12-13 DIAGNOSIS — Q828 Other specified congenital malformations of skin: Secondary | ICD-10-CM | POA: Diagnosis not present

## 2014-12-13 DIAGNOSIS — M79672 Pain in left foot: Secondary | ICD-10-CM

## 2014-12-13 MED ORDER — TRIAMCINOLONE ACETONIDE 10 MG/ML IJ SUSP
10.0000 mg | Freq: Once | INTRAMUSCULAR | Status: AC
  Administered 2014-12-13: 10 mg

## 2014-12-13 NOTE — Progress Notes (Signed)
Subjective:     Patient ID: Shelley Anderson, female   DOB: Mar 07, 1960, 54 y.o.   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 Today I injected the fascial band left 3 mg Kenalog 5 mg Xylocaine and did deep debridement of lesions and underneath lesions of the forefoot were also taken care of. Reappoint to recheck

## 2015-02-08 ENCOUNTER — Other Ambulatory Visit: Payer: Self-pay

## 2015-02-08 DIAGNOSIS — Z1231 Encounter for screening mammogram for malignant neoplasm of breast: Secondary | ICD-10-CM

## 2015-03-10 ENCOUNTER — Ambulatory Visit
Admission: RE | Admit: 2015-03-10 | Discharge: 2015-03-10 | Disposition: A | Discharge status: Home / Self Care | Payer: BC Managed Care – PPO | Source: Ambulatory Visit

## 2015-03-10 DIAGNOSIS — Z1231 Encounter for screening mammogram for malignant neoplasm of breast: Secondary | ICD-10-CM

## 2015-04-07 ENCOUNTER — Encounter: Payer: Self-pay | Admitting: Podiatry

## 2015-04-07 ENCOUNTER — Ambulatory Visit (INDEPENDENT_AMBULATORY_CARE_PROVIDER_SITE_OTHER): Payer: BC Managed Care – PPO | Admitting: Podiatry

## 2015-04-07 DIAGNOSIS — G5762 Lesion of plantar nerve, left lower limb: Secondary | ICD-10-CM

## 2015-04-07 DIAGNOSIS — M779 Enthesopathy, unspecified: Secondary | ICD-10-CM

## 2015-04-07 DIAGNOSIS — Q828 Other specified congenital malformations of skin: Secondary | ICD-10-CM

## 2015-04-07 DIAGNOSIS — G5782 Other specified mononeuropathies of left lower limb: Secondary | ICD-10-CM

## 2015-04-07 NOTE — Patient Instructions (Signed)
Morton Neuralgia  Morton neuralgia is a type of foot pain in the area closest to your toes. This area is sometimes called the ball of your foot. Morton neuralgia occurs when a branch of a nerve in your foot (digital nerve) becomes compressed.   When this happens over a long period of time, the nerve can thicken (neuroma) and cause pain. This usually occurs between the third and fourth toe. Morton neuralgia can come and go but may get worse over time.   CAUSES  Your digital nerve can become compressed and stretched at a point where it passes under a thick band of tissue that connects your toes (intermetatarsal ligament). Morton neuralgia can be caused by mild repetitive damage in this area. This type of damage can result from:   · Activities such as running or jumping.  · Wearing shoes that are too tight.  RISK FACTORS  You may be at risk for Morton neuralgia if you:  · Are female.  · Wear high heels.  · Wear shoes that are narrow or tight.  · Participate in activities that stretch your toes. These include:  ¨ Running.  ¨ Ballet.  ¨ Long-distance walking.  SIGNS AND SYMPTOMS  The first symptom of Morton neuralgia is pain that spreads from the ball of your foot to your toes. It may feel like you are walking on a marble. Pain usually gets worse with walking and goes away at night. Other symptoms may include numbness and cramping of your toes.  DIAGNOSIS   Your health care provider will do a physical exam. When doing the exam, your health care provider may:   · Squeeze your foot just behind your toe.  · Ask you to move your toes to check for pain.  You may also have tests on your foot to confirm the diagnosis. These may include:   · An X-ray.  · An MRI.  TREATMENT   Treatment for Morton neuralgia may be as simple as changing the kind of shoes you wear. Other treatments may include:  · Wearing a supportive pad (orthosis) under the front of your foot. This lifts your toe bones and takes pressure off the nerve.  · Getting  injections of numbing medicine and anti-inflammatory medicine (steroid) in the nerve.  · Having surgery to remove part of the thickened nerve.  HOME CARE INSTRUCTIONS   · Take medicine only as directed by your health care provider.  · Wear soft-soled shoes with a wide toe area.  · Stop activities that may be causing pain.  · Elevate your foot when resting.  · Massage your foot.  · Apply ice to the injured area:      Put ice in a plastic bag.    Place a towel between your skin and the bag.    Leave the ice on for 20 minutes, 2-3 times a day.    · Keep all follow-up visits as directed by your health care provider. This is important.  SEEK MEDICAL CARE IF:  · Home care instructions are not helping you get better.  · Your symptoms change or get worse.     This information is not intended to replace advice given to you by your health care provider. Make sure you discuss any questions you have with your health care provider.     Document Released: 04/02/2000 Document Revised: 01/15/2014 Document Reviewed: 02/25/2013  Elsevier Interactive Patient Education ©2016 [employer]

## 2015-04-07 NOTE — Progress Notes (Signed)
Subjective:     Patient ID: Shelley Anderson, female   DOB: [birth year], 55 y.o.   MRN: [id number]  HPI patient states she's been getting shooting pains between the third and fourth toe of her left foot and admits they've been there for years but she is just gotten used to it but the pain is becoming more intense. Also has plantar calluses on both feet which become painful and states that orthotics were helpful in the past   Review of Systems     Objective:   Physical Exam Neurovascular status intact muscle strength adequate range of motion within normal limits with shooting pains third intermetatarsal space left with radiating discomfort into the adjacent digits and also noted to have mild discomfort in the third metatarsal phalangeal joint left with plantar callus sub-fourth metatarsal bilateral and on the right first metatarsal    Assessment:     Neuroma symptomatology left along with plantar callus formation bilateral    Plan:     H&P and conditions reviewed with patient. At this point we discussed considerations for neuro lysis treatment versus possible neurectomy for the left foot and she wants to research different choices. I did scan her for orthotics today to try to reduce pressure and Cipro can reduce some of the neuroma pain she is experiencing and also went ahead today and debrided all plantar calluses with no iatrogenic bleeding noted patient be seen back 3 weeks to pickup orthotics and discussed alternatives

## 2016-02-10 ENCOUNTER — Other Ambulatory Visit: Payer: Self-pay | Admitting: Family Medicine

## 2016-02-10 DIAGNOSIS — Z1231 Encounter for screening mammogram for malignant neoplasm of breast: Secondary | ICD-10-CM

## 2016-04-09 ENCOUNTER — Ambulatory Visit
Admission: RE | Admit: 2016-04-09 | Discharge: 2016-04-09 | Disposition: A | Discharge status: Home / Self Care | Payer: BC Managed Care – PPO | Source: Ambulatory Visit | Attending: Family Medicine | Admitting: Family Medicine

## 2016-04-09 DIAGNOSIS — Z1231 Encounter for screening mammogram for malignant neoplasm of breast: Secondary | ICD-10-CM

## 2016-09-20 ENCOUNTER — Ambulatory Visit (INDEPENDENT_AMBULATORY_CARE_PROVIDER_SITE_OTHER): Payer: BC Managed Care – PPO

## 2016-09-20 ENCOUNTER — Other Ambulatory Visit: Payer: Self-pay

## 2016-09-20 ENCOUNTER — Ambulatory Visit (INDEPENDENT_AMBULATORY_CARE_PROVIDER_SITE_OTHER): Payer: BC Managed Care – PPO | Admitting: Podiatry

## 2016-09-20 ENCOUNTER — Encounter: Payer: Self-pay | Admitting: Podiatry

## 2016-09-20 ENCOUNTER — Ambulatory Visit: Payer: BC Managed Care – PPO

## 2016-09-20 DIAGNOSIS — M779 Enthesopathy, unspecified: Secondary | ICD-10-CM | POA: Diagnosis not present

## 2016-09-20 DIAGNOSIS — M79676 Pain in unspecified toe(s): Secondary | ICD-10-CM

## 2016-09-20 DIAGNOSIS — Q828 Other specified congenital malformations of skin: Secondary | ICD-10-CM | POA: Diagnosis not present

## 2016-09-20 DIAGNOSIS — S99922A Unspecified injury of left foot, initial encounter: Secondary | ICD-10-CM

## 2016-09-20 DIAGNOSIS — S8992XA Unspecified injury of left lower leg, initial encounter: Secondary | ICD-10-CM | POA: Diagnosis not present

## 2016-09-20 NOTE — Progress Notes (Signed)
   Subjective:    Patient ID: Shelley Anderson, female    DOB: May 03, 1960, 56 y.o.   MRN: [id number]  HPI Chief Complaint  Patient presents with  . Foot Pain    Left, 2nd toe and forefoot x 2 weeks.  . Callouses    B/L forefoot    56 y.o. female returns for f/u of calluses to both feet. Complains of chronic pain from calluses to the bottom of her foot. Has had these pared down at our office before with relief noted. Reports frequent pedicures where they also take care of her calluses.  New complaint of L foot injury. States two weeks ago she slipped on a dress on the floor and twisted her foot. Unsure of position of foot at time of injury. After that time has had pain in her L 2nd toe and swelling.  Review of Systems     Objective:   Physical Exam There were no vitals filed for this visit. General AA&O x3. Normal mood and affect.  Vascular Dorsalis pedis and posterior tibial pulses  present 2+ bilaterally  Capillary refill normal to all digits. Pedal hair growth normal.  Neurologic Epicritic sensation grossly present.  Dermatologic No open lesions. Interspaces clear of maceration. Nails well groomed and normal in appearance. Hyperkeratotic lesions bilat   Orthopedic: MMT 5/5 in dorsiflexion, plantarflexion, inversion, and eversion. Normal joint ROM without pain or crepitus. POP L 2nd toe PIPJ, POP plantar 2nd MPJ. Excessive dorsal drawer L 2nd MPJ with pain. Medial deviation noted to L 2nd toe.   Radiographs: No definite fractures. Questionable L 2nd proximal phalnax head lucency visualized only on oblique, not on AP. Medial deviation L 2nd toe noted.    Assessment & Plan:  Callosities -Advised she does not meet criteria for routine foot care. -Educated on self-care. -Patient states she wishes to get pedicures. Educated on safety aspects of pedicures including sterile instrumentation.  L 2nd Acute Plantar Plate Injury -XR reviewed without e/o fracture. -Signs and symptoms  consistent with plantar plate tear. Patient states her 2nd toe did not deviate medially prior to injury. -L 2nd toe plantarflexion strapping performed. Patient noted relief with taping in this manner. Patient educated on how to perform this taping on her own.  15 minutes of face to face time were spent with the patient. >50% of this was spent on counseling and coordination of care of the above diagnoses and treatment plans.

## 2017-02-26 ENCOUNTER — Other Ambulatory Visit: Payer: Self-pay | Admitting: Family Medicine

## 2017-02-26 DIAGNOSIS — Z1231 Encounter for screening mammogram for malignant neoplasm of breast: Secondary | ICD-10-CM

## 2017-04-04 ENCOUNTER — Other Ambulatory Visit (INDEPENDENT_AMBULATORY_CARE_PROVIDER_SITE_OTHER): Payer: Self-pay | Admitting: Otolaryngology

## 2017-04-04 DIAGNOSIS — J328 Other chronic sinusitis: Secondary | ICD-10-CM

## 2017-04-10 ENCOUNTER — Ambulatory Visit: Payer: BC Managed Care – PPO

## 2017-04-18 ENCOUNTER — Ambulatory Visit
Admission: RE | Admit: 2017-04-18 | Discharge: 2017-04-18 | Disposition: A | Discharge status: Home / Self Care | Payer: BC Managed Care – PPO | Source: Ambulatory Visit | Attending: Otolaryngology | Admitting: Otolaryngology

## 2017-04-18 DIAGNOSIS — J328 Other chronic sinusitis: Secondary | ICD-10-CM

## 2017-04-23 ENCOUNTER — Ambulatory Visit: Payer: BC Managed Care – PPO | Admitting: Sports Medicine

## 2017-04-23 ENCOUNTER — Encounter: Payer: Self-pay | Admitting: Sports Medicine

## 2017-04-23 ENCOUNTER — Telehealth: Payer: Self-pay | Admitting: Sports Medicine

## 2017-04-23 DIAGNOSIS — Q828 Other specified congenital malformations of skin: Secondary | ICD-10-CM

## 2017-04-23 DIAGNOSIS — M79672 Pain in left foot: Secondary | ICD-10-CM | POA: Diagnosis not present

## 2017-04-23 DIAGNOSIS — M79671 Pain in right foot: Secondary | ICD-10-CM

## 2017-04-23 NOTE — Progress Notes (Signed)
Subjective: Shelley Anderson is a 57 y.o. female patient who presents to office for evaluation of Right> Left foot pain secondary to callus skin. Patient complains of pain at the lesion present Right>Left foot at the balls. Patient has tried pedicures and changing shoes with no relief in symptoms. Patient denies any other pedal complaints.   There are no active problems to display for this patient.   Current Outpatient Medications on File Prior to Visit  Medication Sig Dispense Refill  . amLODipine (NORVASC) 5 MG tablet Take 5 mg by mouth daily.  4  . budesonide-formoterol (SYMBICORT) 160-4.5 MCG/ACT inhaler Inhale 2 puffs into the lungs 2 (two) times daily as needed.    . calcium citrate-vitamin D (CITRACAL+D) 315-200 MG-UNIT per tablet Take 1 tablet by mouth 2 (two) times daily. Reported on 04/07/2015    . fluticasone (FLONASE) 50 MCG/ACT nasal spray Place 2 sprays into both nostrils daily.    . Multiple Vitamin (MULTIVITAMIN) tablet Take 1 tablet by mouth daily.    Renda Rolls AD 4-10-325 MG TABS Take 1 tablet by mouth 4 (four) times daily.  4  . omeprazole (PRILOSEC) 40 MG capsule Take 40 mg by mouth daily.  12  . telmisartan-hydrochlorothiazide (MICARDIS HCT) 80-12.5 MG tablet Take 1 tablet by mouth daily.    . vitamin C (ASCORBIC ACID) 500 MG tablet Take 500 mg by mouth daily.     No current facility-administered medications on file prior to visit.     Allergies  Allergen Reactions  . Aspirin Nausea And Vomiting  . Lactose Intolerance (Gi) Other (See Comments)    GI UPSET    Objective:  General: Alert and oriented x3 in no acute distress  Dermatology: Keratotic lesion present sub met 4 bilateral, sub met 2 on right, and plantar hallux on right and left 5th toe with skin lines transversing the lesion, pain is present with direct pressure to the lesion with a central nucleated core noted, no webspace macerations, no ecchymosis bilateral, all nails x 10 are well manicured.  Vascular:  Dorsalis Pedis and Posterior Tibial pedal pulses 2/4, Capillary Fill Time 3 seconds, + pedal hair growth bilateral, no edema bilateral lower extremities, Temperature gradient within normal limits.  Neurology: Johney Maine sensation intact via light touch bilateral.  Musculoskeletal: Mild tenderness with palpation at the keratotic lesion sites on Right>Left, Muscular strength 5/5 in all groups without pain or limitation on range of motion. No lower extremity muscular or boney deformity noted.  Assessment and Plan: Problem List Items Addressed This Visit    None    Visit Diagnoses    Porokeratosis    -  Primary   Bilateral foot pain          -Complete examination performed -Discussed treatment options -Parred keratoic lesion using a chisel blade; treated the area with Salinocaine covered with bandaids  -Encouraged daily skin emollients -Encouraged use of pumice stone -Advised good supportive shoes and inserts; office to contact patient regarding insoles that were made >1 year ago, if we have them we will dispense to patient if not patient will have to be re-casted to help offload the keratotic lesions  -Patient to return to office in 10 weeks for callus care or sooner if condition worsens.  Landis Martins, DPM

## 2017-04-23 NOTE — Telephone Encounter (Signed)
Found pts orthotics that were from 2017 and I called pt to let her know we have them.  I was going to explain about getting a pair that would accommodate  Her current issue and she was in the middle of something and will call me back.

## 2017-04-29 ENCOUNTER — Ambulatory Visit
Admission: RE | Admit: 2017-04-29 | Discharge: 2017-04-29 | Disposition: A | Discharge status: Home / Self Care | Payer: BC Managed Care – PPO | Source: Ambulatory Visit | Attending: Family Medicine | Admitting: Family Medicine

## 2017-04-29 DIAGNOSIS — Z1231 Encounter for screening mammogram for malignant neoplasm of breast: Secondary | ICD-10-CM

## 2018-03-21 ENCOUNTER — Other Ambulatory Visit: Payer: Self-pay | Admitting: Family Medicine

## 2018-03-21 DIAGNOSIS — Z1231 Encounter for screening mammogram for malignant neoplasm of breast: Secondary | ICD-10-CM

## 2018-04-25 ENCOUNTER — Other Ambulatory Visit: Payer: Self-pay | Admitting: Family Medicine

## 2018-04-25 DIAGNOSIS — R1031 Right lower quadrant pain: Secondary | ICD-10-CM

## 2018-04-28 ENCOUNTER — Other Ambulatory Visit: Payer: Self-pay

## 2018-04-28 ENCOUNTER — Ambulatory Visit
Admission: RE | Admit: 2018-04-28 | Discharge: 2018-04-28 | Disposition: A | Discharge status: Home / Self Care | Payer: BC Managed Care – PPO | Source: Ambulatory Visit | Attending: Family Medicine | Admitting: Family Medicine

## 2018-04-28 DIAGNOSIS — R1031 Right lower quadrant pain: Secondary | ICD-10-CM

## 2018-04-28 MED ORDER — IOPAMIDOL (ISOVUE-300) INJECTION 61%
100.0000 mL | Freq: Once | INTRAVENOUS | Status: AC | PRN
  Administered 2018-04-28: 100 mL via INTRAVENOUS

## 2018-04-29 ENCOUNTER — Other Ambulatory Visit: Payer: Self-pay | Admitting: Obstetrics and Gynecology

## 2018-05-01 ENCOUNTER — Ambulatory Visit: Payer: BC Managed Care – PPO

## 2018-06-16 ENCOUNTER — Other Ambulatory Visit: Payer: Self-pay

## 2018-06-16 ENCOUNTER — Ambulatory Visit
Admission: RE | Admit: 2018-06-16 | Discharge: 2018-06-16 | Disposition: A | Discharge status: Home / Self Care | Payer: BC Managed Care – PPO | Source: Ambulatory Visit | Attending: Family Medicine | Admitting: Family Medicine

## 2018-06-16 DIAGNOSIS — Z1231 Encounter for screening mammogram for malignant neoplasm of breast: Secondary | ICD-10-CM

## 2018-08-22 ENCOUNTER — Other Ambulatory Visit: Payer: Self-pay

## 2018-08-22 ENCOUNTER — Ambulatory Visit: Payer: BC Managed Care – PPO | Admitting: Allergy

## 2018-08-22 ENCOUNTER — Encounter: Payer: Self-pay | Admitting: Allergy

## 2018-08-22 VITALS — BP 116/70 | HR 84 | Temp 98.0°F | Resp 18 | Ht 67.0 in | Wt 229.0 lb

## 2018-08-22 DIAGNOSIS — J3089 Other allergic rhinitis: Secondary | ICD-10-CM | POA: Diagnosis not present

## 2018-08-22 DIAGNOSIS — K9049 Malabsorption due to intolerance, not elsewhere classified: Secondary | ICD-10-CM

## 2018-08-22 DIAGNOSIS — J40 Bronchitis, not specified as acute or chronic: Secondary | ICD-10-CM

## 2018-08-22 MED ORDER — AZELASTINE HCL 0.1 % NA SOLN: 2.0000 | Freq: Two times a day (BID) | NASAL | 5 refills | Status: DC | PRN

## 2018-08-22 NOTE — Patient Instructions (Addendum)
-   environmental allergy skin testing is positive to grass pollens, weed pollens, tree pollens, dust mites and molds - allergen avoidance measures discussed/handouts provided - continue Allegra 180mg  daily as long as effective.  If Allegra becomes ineffective then recommend trial of Xyzal 5mg  daily - for nasal congestion continue Flonase 2 sprays each nostril and use for 1-2 weeks at a time before stopping - for nasal drainage recommend use of nasal antihistamine, Astelin 2 sprays each nostril twice a day as needed - recommend starting Singulair for added allergy symptom control as well as respiratory symptom control - also recommend nasal saline rinses to help flush out the sinuses and help medicated nasal sprays work more effectively - allergen immunotherapy (allergy shots) is recommended if medication management does not control your allergy symptoms enough.  Immunotherapy is a 3-5 year process to get the body in a state where you are tolerant to the allergens and thus not dependent on medication for management.  Informational handout provided.  If interested in this therapuetic option you can check with your insurance carrier for coverage.  Let us know if you would like to proceed with this option.    - continue avoidance of dairy due to lactose intolerance and increase in nasal drainage/congestion.  Milk skin testing is negative thus you do not have IgE mediated food allergy  - continue use of Symbicort or Breo during episodes of bronchitis  Follow-up 4 months or sooner if needed

## 2018-08-22 NOTE — Progress Notes (Signed)
New Patient Note  RE: Shelley Boringamera Shands MRN: [id number] DOB: 03/16/1960 Date of Office Visit: 08/22/2018  Referring provider: Renaye RakersBland, Veita, MD Primary care provider: Renaye RakersBland, Veita, MD  Chief Complaint: allergies  History of present illness: Shelley Anderson is a 58 y.o. female presenting today for consultation for allergies.    She states no matter what she tries she continues to have copious nasal drainage.  Also complains of fluid in the ears.  She also reports she normally gets bronchitis 3-4 times a year but this year since she has been in the home more and wear a mask when outside the home she has not gotten bronchitis yet this year.   She has been taking allegra daily the entire year.  She is on flonase as well using 1 to 2 sprays each nostril daily.   She plans to start to Singulair; she has picked up this prescription but has not started this yet.  She can tell difference in symptoms since she has held her Allegra for this appointment today.  She states she feels more fullness in her ear and has more drainage than normal. Her symptoms are worse during the pollen seasons.  For the bronchitis she states that she has both a Symbicort inhaler and breo inhaler.  She states when she develops cough and shortness of breath related to bronchitis that she will use either 1 of these inhalers to help with the symptoms.  She does not have a previous history of asthma or any other respiratory disease.  No history of eczema.  She avoids dairy as it worsens her drainage and also due to lactose intolerance.    Review of systems: Review of Systems  Constitutional: Negative for chills, fever and malaise/fatigue.  HENT: Positive for congestion and ear pain. Negative for ear discharge, hearing loss, nosebleeds, sinus pain and sore throat.   Eyes: Negative for pain, discharge and redness.  Respiratory: Negative.   Cardiovascular: Negative.   Gastrointestinal: Negative.   Musculoskeletal: Negative.    Skin: Negative for itching and rash.  Neurological: Negative for headaches.    All other systems negative unless noted above in HPI  Past medical history: Past Medical History:  Diagnosis Date  . Achilles tendon rupture 05/2011   left  . Acid reflux    OTC as needed  . Diabetes mellitus without complication (HCC)   . Recurrent upper respiratory infection (URI)   . Seasonal allergies     Past surgical history: Past Surgical History:  Procedure Laterality Date  . ACHILLES TENDON SURGERY  05/23/2011   Procedure: ACHILLES TENDON REPAIR;  Surgeon: Harvie JuniorJohn L Graves, MD;  Location: [facility];  Service: Orthopedics;  Laterality: Left;  . FOOT SURGERY  20 yrs. ago   to remove bone spurs left foot  . GANGLION CYST EXCISION  1990   right wrist  . NASAL SEPTUM SURGERY  > 20 yrs. ago  . WISDOM TOOTH EXTRACTION      Family history:  Family History  Problem Relation Age of Onset  . Muscular dystrophy Mother   . Breast cancer Mother   . Kidney disease Father   . Diabetes Father   . Breast cancer Maternal Aunt     Social history:  Socioeconomic History  . Marital status: Married  Tobacco Use  . Smoking status: Never Smoker  . Smokeless tobacco: Never Used    Medication List: Allergies as of 08/22/2018      Reactions   Aspirin Nausea And Vomiting  Lactose Intolerance (gi) Other (See Comments)   GI UPSET      Medication List       Accurate as of August 22, 2018 11:59 PM. If you have any questions, ask your nurse or doctor.        STOP taking these medications   calcium citrate-vitamin D 315-200 MG-UNIT tablet Commonly known as: CITRACAL+D Stopped by: Tychelle Purkey Charmian Muff, MD   Norel AD 4-10-325 MG Tabs Generic drug: Chlorphen-PE-Acetaminophen Stopped by: Charon Smedberg Charmian Muff, MD   vitamin C 500 MG tablet Commonly known as: ASCORBIC ACID Stopped by: Gaius Ishaq Charmian Muff, MD     TAKE these medications   amLODipine 5 MG tablet  Commonly known as: NORVASC Take 5 mg by mouth daily.   azelastine 0.1 % nasal spray Commonly known as: ASTELIN Place 2 sprays into both nostrils 2 (two) times daily as needed for rhinitis. Started by: Kodiak Rollyson Charmian Muff, MD   budesonide-formoterol 160-4.5 MCG/ACT inhaler Commonly known as: SYMBICORT Inhale 2 puffs into the lungs 2 (two) times daily as needed.   fluticasone 50 MCG/ACT nasal spray Commonly known as: FLONASE Place 2 sprays into both nostrils daily.   multivitamin tablet Take 1 tablet by mouth daily.   omeprazole 40 MG capsule Commonly known as: PRILOSEC Take 40 mg by mouth daily.   Ozempic (1 MG/DOSE) 2 MG/1.5ML Sopn Generic drug: Semaglutide (1 MG/DOSE) Inject 1 Syringe into the skin once a week.   telmisartan-hydrochlorothiazide 80-12.5 MG tablet Commonly known as: MICARDIS HCT Take 1 tablet by mouth daily.   valACYclovir 500 MG tablet Commonly known as: VALTREX Take 500 mg by mouth 2 (two) times daily as needed.       Known medication allergies: Allergies  Allergen Reactions  . Aspirin Nausea And Vomiting  . Lactose Intolerance (Gi) Other (See Comments)    GI UPSET     Physical examination: Blood pressure 116/70, pulse 84, temperature 98 F (36.7 C), temperature source Temporal, resp. rate 18, height 5\' 7"  (1.702 m), weight 229 lb (103.9 kg), SpO2 97 %.  General: Alert, interactive, in no acute distress. HEENT: PERRLA, TMs pearly gray, turbinates minimally edematous with clear discharge, post-pharynx non erythematous. Neck: Supple without lymphadenopathy. Lungs: Clear to auscultation without wheezing, rhonchi or rales. {no increased work of breathing. CV: Normal S1, S2 without murmurs. Abdomen: Nondistended, nontender. Skin: Warm and dry, without lesions or rashes. Extremities:  No clubbing, cyanosis or edema. Neuro:   Grossly intact.  Diagnositics/Labs:  Allergy testing: Environmental allergy skin prick testing is positive to  dust mite (d far), Rhizopus, pecan pollen, English plantain, grass pollens.  Intradermal testing is positive to mold mix 1. Skin prick to milk is negative. Allergy testing results were read and interpreted by provider, documented by clinical staff.   Assessment and plan:   Allergic rhinitis - environmental allergy skin testing is positive to grass pollens, weed pollens, tree pollens, dust mites and molds - allergen avoidance measures discussed/handouts provided - continue Allegra 180mg  daily as long as effective.  If Allegra becomes ineffective then recommend trial of Xyzal 5mg  daily - for nasal congestion continue Flonase 2 sprays each nostril and use for 1-2 weeks at a time before stopping - for nasal drainage recommend use of nasal antihistamine, Astelin 2 sprays each nostril twice a day as needed - recommend starting Singulair for added allergy symptom control as well as respiratory symptom control - also recommend nasal saline rinses to help flush out the sinuses and help medicated nasal sprays work  more effectively - allergen immunotherapy (allergy shots) is recommended if medication management does not control your allergy symptoms enough.  Immunotherapy is a 3-5 year process to get the body in a state where you are tolerant to the allergens and thus not dependent on medication for management.  Informational handout provided.  If interested in this therapuetic option you can check with your insurance carrier for coverage.  Let us know if you would like to proceed with this option.    Food intolerance - continue avoidance of dairy due to lactose intolerance and increase in nasal drainage/congestion.  Milk skin testing is negative thus you do not have IgE mediated food allergy  Recurrent bronchitis - continue use of Symbicort or Breo during episodes of bronchitis  Follow-up 4 months or sooner if needed  I appreciate the opportunity to take part in Vidya's care. Please do not hesitate  to contact me with questions.  Sincerely,   Margo AyeShaylar Sayda Grable, MD Allergy/Immunology [facility]

## 2019-01-23 ENCOUNTER — Other Ambulatory Visit: Payer: Self-pay | Admitting: Allergy

## 2019-02-16 ENCOUNTER — Other Ambulatory Visit: Payer: Self-pay | Admitting: Allergy

## 2019-03-19 ENCOUNTER — Ambulatory Visit: Payer: BC Managed Care – PPO | Attending: Family

## 2019-03-19 DIAGNOSIS — Z23 Encounter for immunization: Secondary | ICD-10-CM

## 2019-03-19 NOTE — Progress Notes (Signed)
   Covid-19 Vaccination Clinic  Name:  Shelley Anderson    MRN: [id number] DOB: 09/12/1960  03/19/2019  Shelley Anderson was observed post Covid-19 immunization for 15 minutes without incident. She was provided with Vaccine Information Sheet and instruction to access the V-Safe system.   Shelley Anderson was instructed to call 911 with any severe reactions post vaccine: Marland Kitchen Difficulty breathing  . Swelling of face and throat  . A fast heartbeat  . A bad rash all over body  . Dizziness and weakness   Immunizations Administered    Name Date Dose VIS Date Route   Moderna COVID-19 Vaccine 03/19/2019  3:50 PM 0.5 mL 12/09/2018 Intramuscular   Manufacturer: Moderna   Lot: [id number]   NDC: 56861-683-72

## 2019-03-27 ENCOUNTER — Other Ambulatory Visit: Payer: Self-pay | Admitting: Allergy

## 2019-04-16 ENCOUNTER — Ambulatory Visit: Payer: BC Managed Care – PPO | Attending: Family

## 2019-04-16 DIAGNOSIS — Z23 Encounter for immunization: Secondary | ICD-10-CM

## 2019-04-16 NOTE — Progress Notes (Signed)
   Covid-19 Vaccination Clinic  Name:  Shelley Anderson    MRN: [id number] DOB: Aug 16, 1960  04/16/2019  [name] was observed post Covid-19 immunization for 15 minutes without incident. She was provided with Vaccine Information Sheet and instruction to access the V-Safe system.   [name] was instructed to call 911 with any severe reactions post vaccine: Marland Kitchen Difficulty breathing  . Swelling of face and throat  . A fast heartbeat  . A bad rash all over body  . Dizziness and weakness   Immunizations Administered    Name Date Dose VIS Date Route   Moderna COVID-19 Vaccine 04/16/2019 12:53 PM 0.5 mL 12/09/2018 Intramuscular   Manufacturer: Moderna   Lot: [id number]   NDC: 53912-258-34

## 2019-04-21 ENCOUNTER — Ambulatory Visit: Payer: BC Managed Care – PPO

## 2019-05-15 ENCOUNTER — Other Ambulatory Visit: Payer: Self-pay | Admitting: Family Medicine

## 2019-05-15 DIAGNOSIS — Z1231 Encounter for screening mammogram for malignant neoplasm of breast: Secondary | ICD-10-CM

## 2019-05-21 IMAGING — CT CT MAXILLOFACIAL W/O CM
3 of 5 series · 13 of 47 positions shown, 15 images · non-contrast
Comparison: None.

CLINICAL DATA: Teeth pain for 3 months.  Sinus surgery.

EXAM:
CT MAXILLOFACIAL WITHOUT CONTRAST
TECHNIQUE: Multidetector CT images of the paranasal sinuses were obtained using
the standard protocol without intravenous contrast.

[Series 2: sinus 2.00 hr60 s3 ax · axial · 0.34mm/px · z∈[-900,-768]mm · 7 of 86 slices shown, 9 images]
[im 10/86  brain]
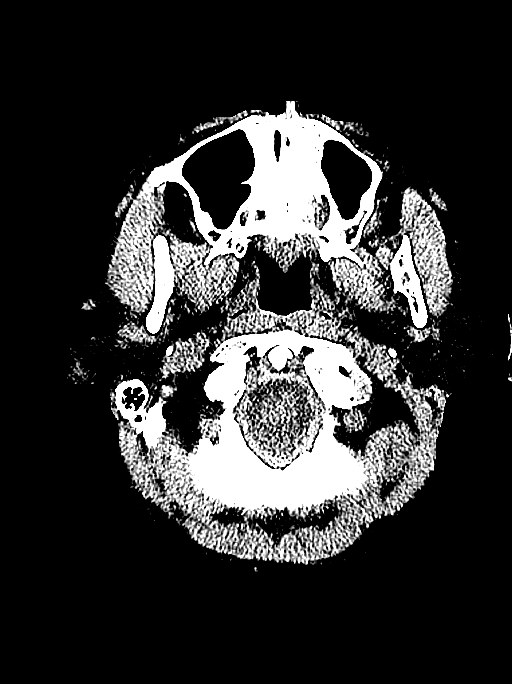
[im 10/86  bone]
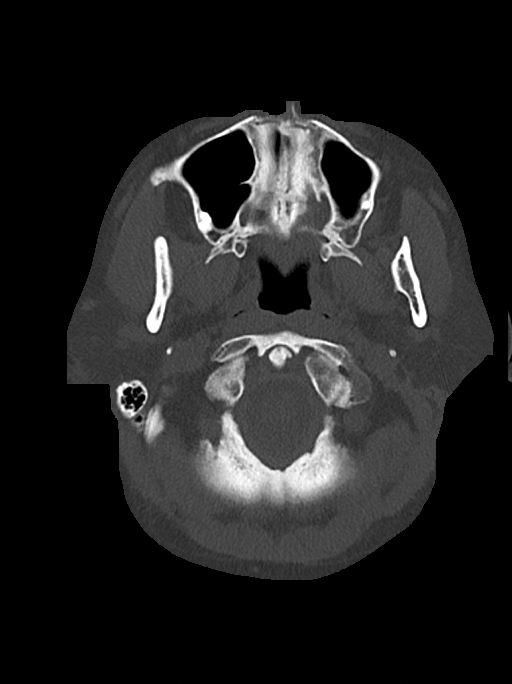
[im 19/86  bone]
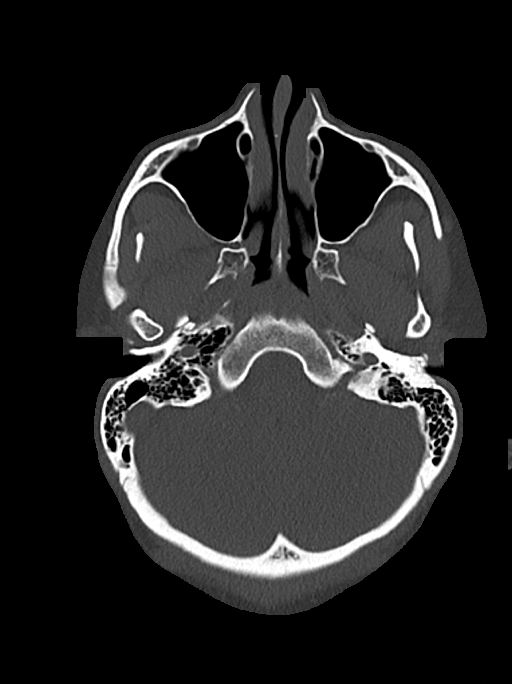
[im 29/86  bone]
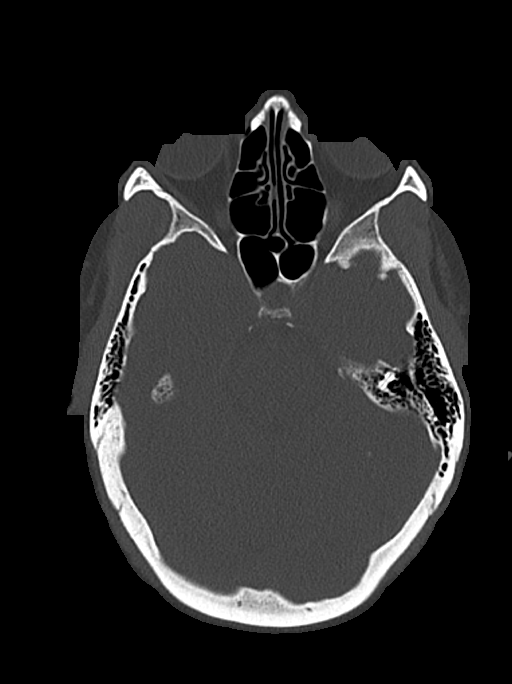
[im 48/86  bone]
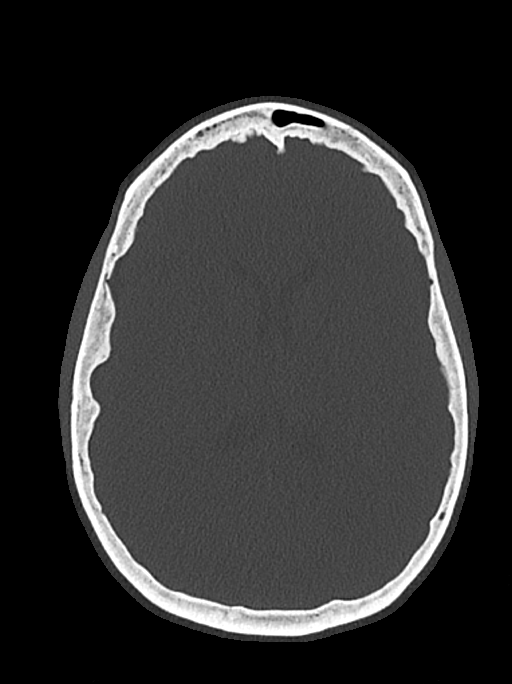
[im 57/86  brain]
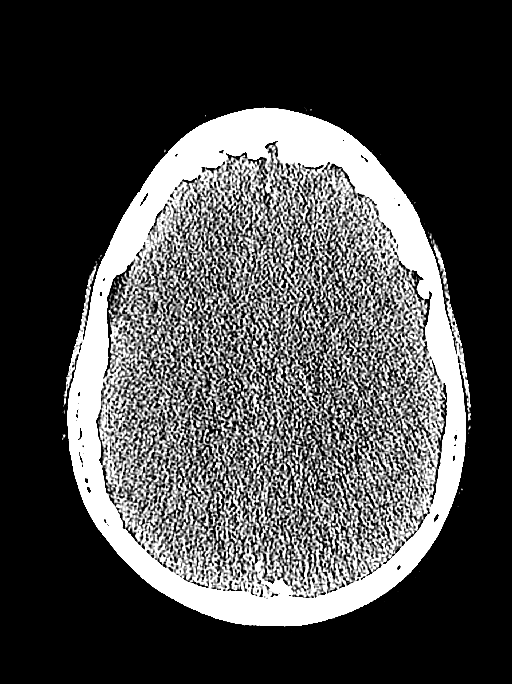
[im 57/86  bone]
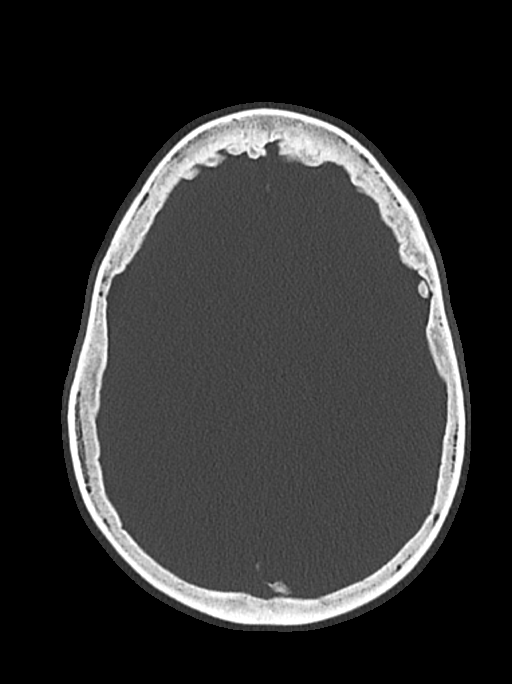
[im 67/86  bone]
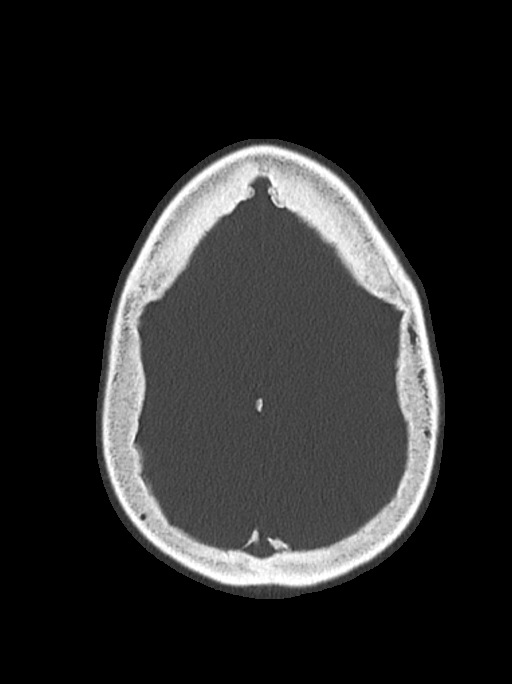
[im 76/86  bone]
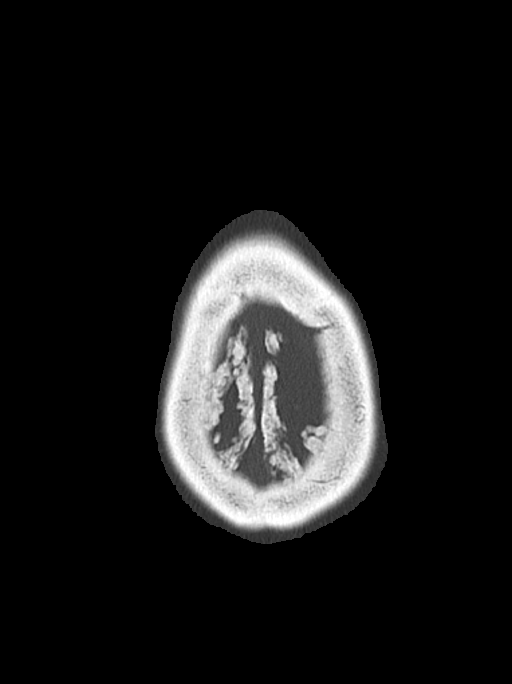

[Series 4: sinus 2.00 hr60 s3 cor · coronal · 0.34mm/px · 3 of 117 slices shown]
[im 39/117  bone]
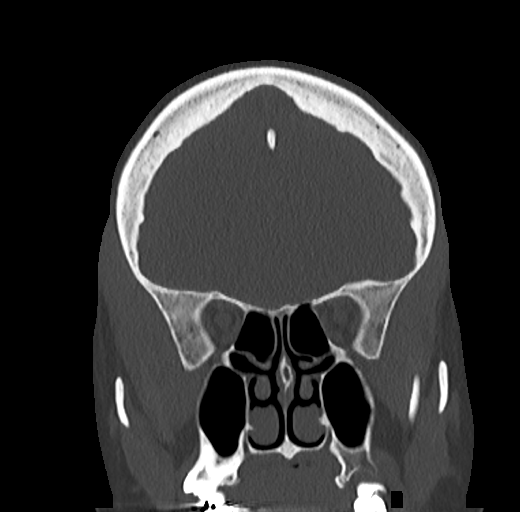
[im 52/117  bone]
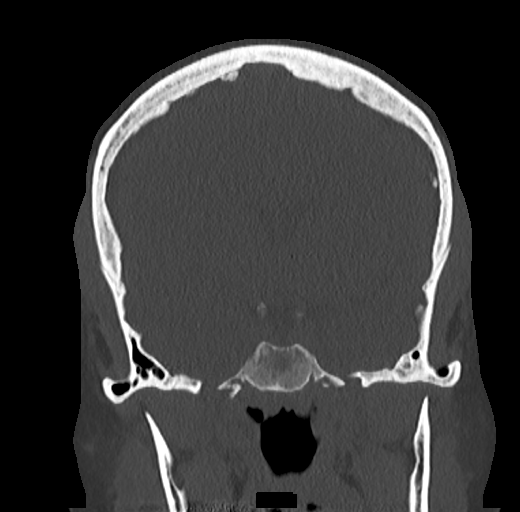
[im 65/117  bone]
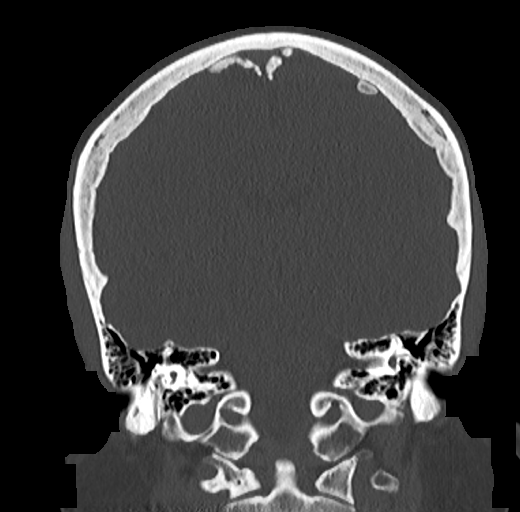

[Series 6: sinus 2.00 hr60 s3 sag · sagittal · 0.34mm/px · 3 of 87 slices shown]
[im 29/87  bone]
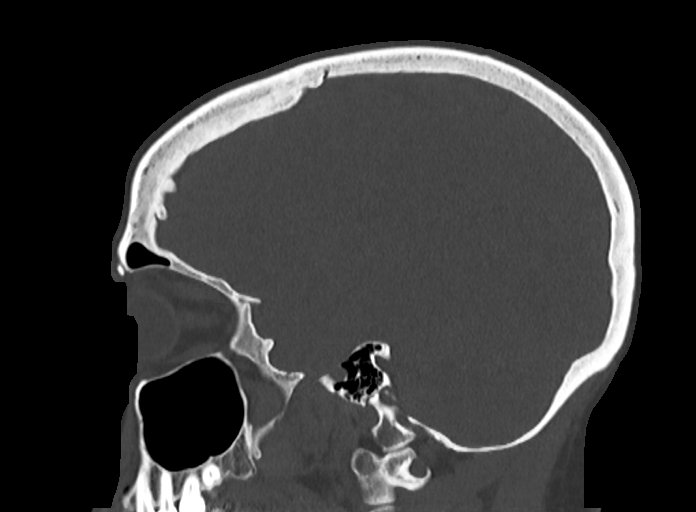
[im 44/87  bone]
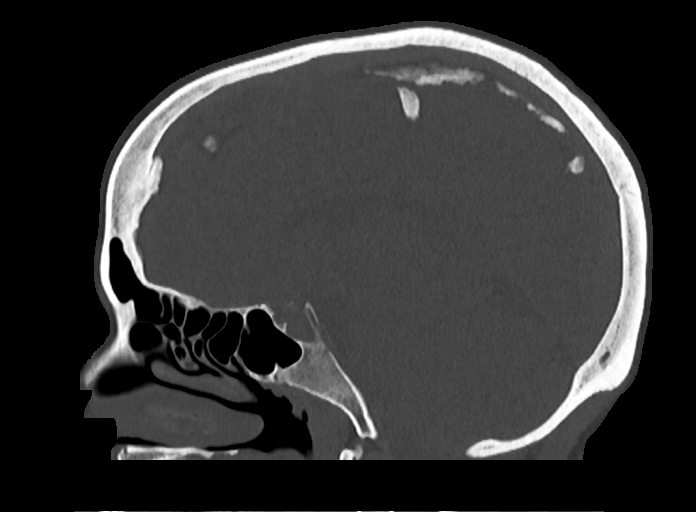
[im 58/87  bone]
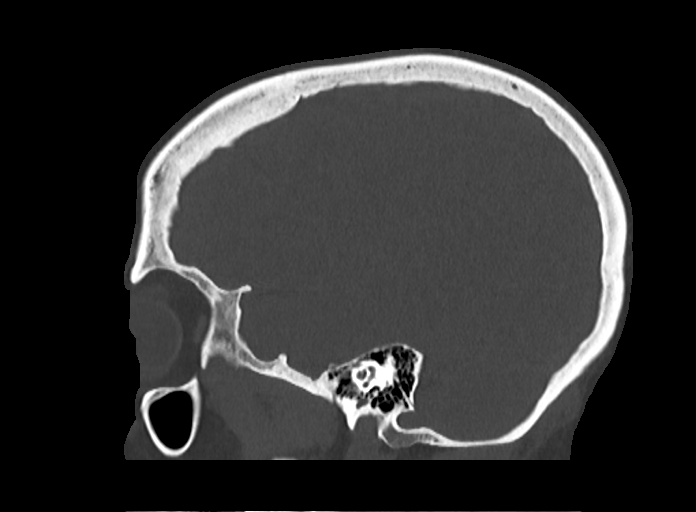

[13 of 47 positions shown; findings below may reference images not displayed]

FINDINGS: Paranasal sinuses:

Frontal: Normally aerated. Patent frontal sinus drainage pathways.

Ethmoid: Normally aerated.

Maxillary: Normally aerated.

Sphenoid: Normally aerated. Patent sphenoethmoidal recesses.

Right ostiomeatal unit: Patent.

Left ostiomeatal unit: Mildly narrowed, due to hypertrophied
ethmoidal bulla.

Nasal passages: BILATERAL inferior turbinate hypertrophy. Nasal
septum midline, with a 3 mm perforation.

Anatomy: There is frontal sinus pneumatization superior to the
anterior ethmoid notches. Symmetric and intact olfactory grooves and
fovea ethmoidalis, Keros II (4-7mm) Presellar sphenoid
pneumatization pattern. LEFT Onodi cell without definite dehiscence.

Other: Orbits and intracranial compartment are unremarkable. Visible
mastoid air cells are normally aerated. No periapical lucencies in
the maxillary teeth.
IMPRESSION: Normally aerated paranasal sinuses.

LEFT ostiomeatal unit is narrowed due to hypertrophied ethmoidal
bulla.

Nasal septum midline with a 3 mm perforation.

LEFT Onodi cell without definite dehiscence.

## 2019-06-23 ENCOUNTER — Ambulatory Visit
Admission: RE | Admit: 2019-06-23 | Discharge: 2019-06-23 | Disposition: A | Discharge status: Home / Self Care | Payer: BC Managed Care – PPO | Source: Ambulatory Visit | Attending: Family Medicine | Admitting: Family Medicine

## 2019-06-23 ENCOUNTER — Other Ambulatory Visit: Payer: Self-pay

## 2019-06-23 DIAGNOSIS — Z1231 Encounter for screening mammogram for malignant neoplasm of breast: Secondary | ICD-10-CM

## 2019-11-30 ENCOUNTER — Ambulatory Visit: Payer: BC Managed Care – PPO | Attending: Family

## 2019-11-30 DIAGNOSIS — Z23 Encounter for immunization: Secondary | ICD-10-CM

## 2020-03-15 NOTE — Progress Notes (Signed)
   Covid-19 Vaccination Clinic  Name:  Shelley Anderson    MRN: [id number] DOB: 07-18-1960  03/15/2020  Shelley Anderson was observed post Covid-19 immunization for 15 minutes without incident. She was provided with Vaccine Information Sheet and instruction to access the V-Safe system.   Shelley Anderson was instructed to call 911 with any severe reactions post vaccine: Marland Kitchen Difficulty breathing  . Swelling of face and throat  . A fast heartbeat  . A bad rash all over body  . Dizziness and weakness   Immunizations Administered    Name Date Dose VIS Date Route   Moderna Covid-19 Booster Vaccine 11/30/2019  6:00 PM 0.25 mL 10/28/2019 Intramuscular   Manufacturer: Moderna   Lot: [id number]   NDC: 94174-081-44

## 2020-04-07 ENCOUNTER — Ambulatory Visit: Payer: BC Managed Care – PPO | Admitting: Sports Medicine

## 2020-04-07 ENCOUNTER — Other Ambulatory Visit: Payer: Self-pay

## 2020-04-07 ENCOUNTER — Ambulatory Visit (INDEPENDENT_AMBULATORY_CARE_PROVIDER_SITE_OTHER): Payer: BC Managed Care – PPO

## 2020-04-07 ENCOUNTER — Other Ambulatory Visit: Payer: Self-pay | Admitting: Sports Medicine

## 2020-04-07 ENCOUNTER — Encounter: Payer: Self-pay | Admitting: Sports Medicine

## 2020-04-07 DIAGNOSIS — M79671 Pain in right foot: Secondary | ICD-10-CM

## 2020-04-07 DIAGNOSIS — M25571 Pain in right ankle and joints of right foot: Secondary | ICD-10-CM

## 2020-04-07 DIAGNOSIS — M2141 Flat foot [pes planus] (acquired), right foot: Secondary | ICD-10-CM | POA: Diagnosis not present

## 2020-04-07 DIAGNOSIS — M779 Enthesopathy, unspecified: Secondary | ICD-10-CM

## 2020-04-07 DIAGNOSIS — M2142 Flat foot [pes planus] (acquired), left foot: Secondary | ICD-10-CM

## 2020-04-07 MED ORDER — TRIAMCINOLONE ACETONIDE 10 MG/ML IJ SUSP: 10.0000 mg | Freq: Once | INTRAMUSCULAR | Status: DC

## 2020-04-07 NOTE — Progress Notes (Signed)
Subjective: Shelley Anderson is a 60 y.o. female patient who presents to office for evaluation of right foot pain. Patient complains of progressive pain especially over the last sharp pain over the foot and ankle x 6 months. Denies injury but does states that she has some shifting of her heels when she is in a shoe because of the narrow nature of her heel. Patient admits thickness to her callus skin on the right and rubbing at her left 5th toe.  Review of Systems  All other systems reviewed and are negative.   There are no problems to display for this patient.   Current Outpatient Medications on File Prior to Visit  Medication Sig Dispense Refill  . fluocinonide ointment (LIDEX) 0.05 % Apply to crown 2-3 times per week. Not to face.    Marland Kitchen amLODipine (NORVASC) 5 MG tablet Take 5 mg by mouth daily.  4  . Azelastine HCl 137 MCG/SPRAY SOLN PLACE 2 SPRAYS INTO BOTH NOSTRILS 2 (TWO) TIMES DAILY AS NEEDED FOR RHINITIS. 30 mL 0  . budesonide-formoterol (SYMBICORT) 160-4.5 MCG/ACT inhaler Inhale 2 puffs into the lungs 2 (two) times daily as needed.    . fexofenadine (ALLEGRA) 60 MG tablet Take by mouth.    . fluticasone (FLONASE) 50 MCG/ACT nasal spray Place 2 sprays into both nostrils daily.    . Multiple Vitamin (MULTIVITAMIN) tablet Take 1 tablet by mouth daily.    Marland Kitchen omeprazole (PRILOSEC) 40 MG capsule Take 40 mg by mouth daily.  12  . OZEMPIC, 1 MG/DOSE, 4 MG/3ML SOPN Inject into the skin.    Marland Kitchen telmisartan-hydrochlorothiazide (MICARDIS HCT) 80-12.5 MG tablet Take 1 tablet by mouth daily.    . valACYclovir (VALTREX) 500 MG tablet Take 500 mg by mouth 2 (two) times daily as needed.     No current facility-administered medications on file prior to visit.    Allergies  Allergen Reactions  . Aspirin Nausea And Vomiting  . Lactose Intolerance (Gi) Other (See Comments)    GI UPSET    Objective:  General: Alert and oriented x3 in no acute distress  Dermatology: No open lesions bilateral lower  extremities, no webspace macerations, no ecchymosis bilateral, all nails x 10 are well manicured. Corn to left 5th toe minimal. + Reactive keratosis plantar hallux and submet 2 on right.  Vascular: Dorsalis Pedis and Posterior Tibial pedal pulses palpable, Capillary Fill Time 3 seconds,(+) pedal hair growth bilateral, no edema bilateral lower extremities, Temperature gradient within normal limits.  Neurology: Gross sensation intact via light touch bilateral, subjective burning and throbbing to the right dorsal lateral foot and ankle over the sinus tarsi.  Musculoskeletal: Mild to moderate tenderness with palpation at right sinus tarsi likely consistent with sinus tarsi syndrome.  No limitation with range of motion except with plantarflexion and direct palpation over the sinus tarsi on right.  Patient also has flexible flat feet.   Xrays  Right foot   Impression: No acute osseous findings  Assessment and Plan: Problem List Items Addressed This Visit   None   Visit Diagnoses    Foot pain, right    -  Primary   Relevant Orders   DG Foot Complete Right   Sinus tarsi syndrome of right ankle       Relevant Medications   triamcinolone acetonide (KENALOG) 10 MG/ML injection 10 mg (Start on 04/07/2020  1:00 PM)   Pes planus of both feet       Capsulitis       Relevant Medications  triamcinolone acetonide (KENALOG) 10 MG/ML injection 10 mg (Start on 04/07/2020  1:00 PM)       -Complete examination performed -Xrays reviewed -Discussed treatement options for sinus tarsi syndrome -After oral consent and aseptic prep, injected a mixture containing 1 ml of 2%  plain lidocaine, 1 ml 0.5% plain marcaine, 0.5 ml of kenalog 10 and 0.5 ml of dexamethasone phosphate into right lateral foot and ankle over sinus tarsi without complication. Post-injection care discussed with patient.  -Dispensed ankle gauntlet for patient to use as instructed on right -At no additional charge mechanically debrided  keratosis x2 on right using a 15 blade without incident and dispensed a toe cap for the left -Patient to return to office if no better after 5 weeks or sooner if condition worsens.  Asencion Islam, DPM

## 2020-04-07 NOTE — Patient Instructions (Signed)
Sinus tarsi syndrome

## 2020-05-12 ENCOUNTER — Ambulatory Visit: Payer: BC Managed Care – PPO | Admitting: Sports Medicine

## 2020-05-12 ENCOUNTER — Other Ambulatory Visit: Payer: Self-pay

## 2020-05-12 ENCOUNTER — Encounter: Payer: Self-pay | Admitting: Sports Medicine

## 2020-05-12 DIAGNOSIS — M79671 Pain in right foot: Secondary | ICD-10-CM

## 2020-05-12 DIAGNOSIS — T148XXA Other injury of unspecified body region, initial encounter: Secondary | ICD-10-CM

## 2020-05-12 DIAGNOSIS — M25571 Pain in right ankle and joints of right foot: Secondary | ICD-10-CM | POA: Diagnosis not present

## 2020-05-12 DIAGNOSIS — M779 Enthesopathy, unspecified: Secondary | ICD-10-CM | POA: Diagnosis not present

## 2020-05-12 DIAGNOSIS — M84374A Stress fracture, right foot, initial encounter for fracture: Secondary | ICD-10-CM | POA: Diagnosis not present

## 2020-05-12 NOTE — Progress Notes (Signed)
Subjective: Shelley Anderson is a 60 y.o. female patient who presents to office for Follow up evaluation of right foot/ankle pain. Patient complains of left sharp pain to the foot after the injection but still has pain reports that when she rolls her foot the outside the pain shoots up her lateral side of the foot and ankle.  Patient reports that the brace has not helped either.  Patient denies any other pedal complaints at this time.  There are no problems to display for this patient.   Current Outpatient Medications on File Prior to Visit  Medication Sig Dispense Refill  . amLODipine (NORVASC) 5 MG tablet Take 5 mg by mouth daily.  4  . Azelastine HCl 137 MCG/SPRAY SOLN PLACE 2 SPRAYS INTO BOTH NOSTRILS 2 (TWO) TIMES DAILY AS NEEDED FOR RHINITIS. 30 mL 0  . budesonide-formoterol (SYMBICORT) 160-4.5 MCG/ACT inhaler Inhale 2 puffs into the lungs 2 (two) times daily as needed.    . fexofenadine (ALLEGRA) 60 MG tablet Take by mouth.    . fluocinonide ointment (LIDEX) 0.05 % Apply to crown 2-3 times per week. Not to face.    . fluticasone (FLONASE) 50 MCG/ACT nasal spray Place 2 sprays into both nostrils daily.    . Multiple Vitamin (MULTIVITAMIN) tablet Take 1 tablet by mouth daily.    Marland Kitchen omeprazole (PRILOSEC) 40 MG capsule Take 40 mg by mouth daily.  12  . OZEMPIC, 1 MG/DOSE, 4 MG/3ML SOPN Inject into the skin.    Marland Kitchen telmisartan-hydrochlorothiazide (MICARDIS HCT) 80-12.5 MG tablet Take 1 tablet by mouth daily.    . valACYclovir (VALTREX) 500 MG tablet Take 500 mg by mouth 2 (two) times daily as needed.     Current Facility-Administered Medications on File Prior to Visit  Medication Dose Route Frequency Provider Last Rate Last Admin  . triamcinolone acetonide (KENALOG) 10 MG/ML injection 10 mg  10 mg Other Once Asencion Islam, DPM        Allergies  Allergen Reactions  . Aspirin Nausea And Vomiting  . Lactose Intolerance (Gi) Other (See Comments)    GI UPSET    Objective:  General: Alert  and oriented x3 in no acute distress  Dermatology: No open lesions bilateral lower extremities, no webspace macerations, no ecchymosis bilateral, all nails x 10 are well manicured. Corn to left 5th toe minimal. + Reactive keratosis plantar hallux and submet 2 on right.  Vascular: Dorsalis Pedis and Posterior Tibial pedal pulses palpable, Capillary Fill Time 3 seconds,(+) pedal hair growth bilateral, no edema bilateral lower extremities, Temperature gradient within normal limits.  Neurology: Gross sensation intact via light touch bilateral, subjective burning and throbbing to the right dorsal lateral foot and ankle over the sinus tarsi.  Musculoskeletal: Mild to moderate tenderness with palpation at right sinus tarsi likely consistent with sinus tarsi syndrome.  However pain is worse today along the peroneal insertion especially at the fifth metatarsal base extending onto the fifth metatarsal shaft.  No limitation with range of motion except with plantarflexion and direct palpation over the sinus tarsi on right.  Pain is worse with supination of the right foot and ankle.  Patient also has flexible flat feet upon weightbearing.   Assessment and Plan: Problem List Items Addressed This Visit   None   Visit Diagnoses    Foot pain, right    -  Primary   Sinus tarsi syndrome of right ankle       Capsulitis       Tendon tear  Relevant Orders   MR ANKLE RIGHT WO CONTRAST   MR FOOT RIGHT WO CONTRAST   Stress fracture of right foot, initial encounter       Relevant Orders   MR ANKLE RIGHT WO CONTRAST   MR FOOT RIGHT WO CONTRAST       -Complete examination performed -Discussed treatment options for continued pain in the right foot and ankle for several months not relieved by bracing and injection -Dispensed cam boot for patient to wear as directed -Ordered MRI for further evaluation of possible tear along the peroneal tendon versus bone bruise or contusion or stress reaction especially at  the fifth metatarsal which did not show any evidence of of any acute osseous findings on previous x-ray -Handicap placard provided -Work note provided to allow boot due to right foot problem/injury -Patient to return to office after MRI or sooner if condition worsens.  Asencion Islam, DPM

## 2020-05-16 ENCOUNTER — Other Ambulatory Visit: Payer: Self-pay | Admitting: Family Medicine

## 2020-05-16 DIAGNOSIS — Z1231 Encounter for screening mammogram for malignant neoplasm of breast: Secondary | ICD-10-CM

## 2020-06-01 ENCOUNTER — Telehealth: Payer: Self-pay | Admitting: Sports Medicine

## 2020-06-01 NOTE — Telephone Encounter (Signed)
Pt called stating she received a boot at her last visit and it is falling apart. She said it is scratching her leg if she does not wear pants.  I told pt to bring the boot back and one of the assistants will exchange it for her. I told her our hours are 8 to 5 closed for lunch between 12 and 1.  She also asked about the cost of the boot as she heard it was like 800 dollars. She has not received a bill or eob yet. I transferred her to Chip Boer in billing dept for that.

## 2020-06-02 NOTE — Telephone Encounter (Signed)
Patient came to the office to pick-up new short CAM boot. The boot that she had was worn out with holes in the material and not providing much support. The new short medium CAM boot was tried on, the patient was satisfied with the fit and comfort of the boot. New boot was given in exchange for the old damaged one.

## 2020-07-08 ENCOUNTER — Ambulatory Visit
Admission: RE | Admit: 2020-07-08 | Discharge: 2020-07-08 | Disposition: A | Discharge status: Home / Self Care | Payer: BC Managed Care – PPO | Source: Ambulatory Visit | Attending: Family Medicine | Admitting: Family Medicine

## 2020-07-08 ENCOUNTER — Other Ambulatory Visit: Payer: Self-pay

## 2020-07-08 DIAGNOSIS — Z1231 Encounter for screening mammogram for malignant neoplasm of breast: Secondary | ICD-10-CM

## 2020-08-08 ENCOUNTER — Other Ambulatory Visit: Payer: Self-pay

## 2020-08-08 ENCOUNTER — Ambulatory Visit
Admission: RE | Admit: 2020-08-08 | Discharge: 2020-08-08 | Disposition: A | Discharge status: Home / Self Care | Payer: BC Managed Care – PPO | Source: Ambulatory Visit | Attending: Sports Medicine | Admitting: Sports Medicine

## 2020-08-08 DIAGNOSIS — T148XXA Other injury of unspecified body region, initial encounter: Secondary | ICD-10-CM

## 2020-08-08 DIAGNOSIS — M84374A Stress fracture, right foot, initial encounter for fracture: Secondary | ICD-10-CM

## 2020-08-11 ENCOUNTER — Encounter: Payer: Self-pay | Admitting: Sports Medicine

## 2020-08-11 ENCOUNTER — Other Ambulatory Visit: Payer: Self-pay

## 2020-08-11 ENCOUNTER — Ambulatory Visit: Payer: BC Managed Care – PPO | Admitting: Sports Medicine

## 2020-08-11 DIAGNOSIS — M79671 Pain in right foot: Secondary | ICD-10-CM | POA: Diagnosis not present

## 2020-08-11 DIAGNOSIS — M84374D Stress fracture, right foot, subsequent encounter for fracture with routine healing: Secondary | ICD-10-CM | POA: Diagnosis not present

## 2020-08-11 NOTE — Progress Notes (Signed)
Subjective: Shelley Anderson is a 60 y.o. female patient who presents to office for Follow up evaluation of right foot/ankle pain. Patient reports that she puts the right amount of pressure on her foot she can still feel the pain but the sharp shooting and numbness has gotten better.  Patient also is here for MRI results.  Patient denies any other pedal complaints at this time.  Currently not wearing her cam boot.  There are no problems to display for this patient.   Current Outpatient Medications on File Prior to Visit  Medication Sig Dispense Refill   amLODipine (NORVASC) 5 MG tablet Take 5 mg by mouth daily.  4   Azelastine HCl 137 MCG/SPRAY SOLN PLACE 2 SPRAYS INTO BOTH NOSTRILS 2 (TWO) TIMES DAILY AS NEEDED FOR RHINITIS. 30 mL 0   budesonide-formoterol (SYMBICORT) 160-4.5 MCG/ACT inhaler Inhale 2 puffs into the lungs 2 (two) times daily as needed.     fexofenadine (ALLEGRA) 60 MG tablet Take by mouth.     fluocinonide ointment (LIDEX) 0.05 % Apply to crown 2-3 times per week. Not to face.     fluticasone (FLONASE) 50 MCG/ACT nasal spray Place 2 sprays into both nostrils daily.     Multiple Vitamin (MULTIVITAMIN) tablet Take 1 tablet by mouth daily.     omeprazole (PRILOSEC) 40 MG capsule Take 40 mg by mouth daily.  12   OZEMPIC, 1 MG/DOSE, 4 MG/3ML SOPN Inject into the skin.     telmisartan-hydrochlorothiazide (MICARDIS HCT) 80-12.5 MG tablet Take 1 tablet by mouth daily.     valACYclovir (VALTREX) 500 MG tablet Take 500 mg by mouth 2 (two) times daily as needed.     Current Facility-Administered Medications on File Prior to Visit  Medication Dose Route Frequency Provider Last Rate Last Admin   triamcinolone acetonide (KENALOG) 10 MG/ML injection 10 mg  10 mg Other Once Asencion Islam, DPM        Allergies  Allergen Reactions   Aspirin Nausea And Vomiting   Lactose Intolerance (Gi) Other (See Comments)    GI UPSET    Objective:  General: Alert and oriented x3 in no acute  distress  Dermatology: No open lesions bilateral lower extremities, no webspace macerations, no ecchymosis bilateral, all nails x 10 are well manicured. Corn to left 5th toe minimal. + Minimal reactive keratosis plantar hallux and submet 2 on right.  Vascular: Dorsalis Pedis and Posterior Tibial pedal pulses palpable, Capillary Fill Time 3 seconds,(+) pedal hair growth bilateral, no edema bilateral lower extremities, Temperature gradient within normal limits.  Neurology: Michaell Cowing sensation intact via light touch bilateral, resolved burning to the dorsal lateral foot and ankle on the right  Musculoskeletal: Mild to moderate tenderness with palpation at the bases of the fourth and fifth metatarsals at the midtarsal joint pain is worsened with plantarflexion and inversion of the right foot especially upon weightbearing.  Patient also has flexible flat feet upon weightbearing.   Assessment and Plan: Problem List Items Addressed This Visit   None Visit Diagnoses     Stress fracture of right foot with routine healing, subsequent encounter    -  Primary   Foot pain, right            -Complete examination performed -MRI results reviewed with patient consistent with stress fractures at the fourth and fifth metatarsal bases as well as an old ankle sprain/ligament tear of the ATFL -Discussed treatment options for continued pain in the right foot and ankle -Advised patient to continue  with cam boot for patient to wear as directed -Advised patient that she must be compliant with wearing the boot in order to allow sufficient time for the stress fractures to heal advised patient that at next visit we will x-ray to follow-up these fractures and if continues to still be painful we will request a bone stimulator if not completely healed since it has been more than 3 months since her symptoms presented -Patient to return to office in 1 month for x-rays or sooner if condition worsens.  Asencion Islam,  DPM

## 2020-10-24 ENCOUNTER — Ambulatory Visit: Payer: BC Managed Care – PPO

## 2020-10-24 ENCOUNTER — Ambulatory Visit (INDEPENDENT_AMBULATORY_CARE_PROVIDER_SITE_OTHER): Payer: BC Managed Care – PPO

## 2020-10-24 ENCOUNTER — Other Ambulatory Visit: Payer: Self-pay

## 2020-10-24 ENCOUNTER — Encounter: Payer: Self-pay | Admitting: Podiatry

## 2020-10-24 ENCOUNTER — Other Ambulatory Visit: Payer: Self-pay | Admitting: Podiatry

## 2020-10-24 ENCOUNTER — Ambulatory Visit (INDEPENDENT_AMBULATORY_CARE_PROVIDER_SITE_OTHER): Payer: BC Managed Care – PPO | Admitting: Podiatry

## 2020-10-24 DIAGNOSIS — M84374G Stress fracture, right foot, subsequent encounter for fracture with delayed healing: Secondary | ICD-10-CM

## 2020-10-24 NOTE — Progress Notes (Signed)
  Subjective:  Patient ID: Shelley Anderson, female    DOB: 07/29/1960,   MRN: [id number]  Chief Complaint  Patient presents with   Fracture    Follow up stress fracture and ligament tear right - patient states foot is no better. Declined xrays today.    60 y.o. female presents for follow-up of stress fracture. She has been dealing with this since March. She has been in a boot since then. Although today presents in regular shoes. MRI showed stress fracture of fourth and fifth metatarsals. She was seen by Dr. Marylene Land last who recommended if no improvement trying a bone stimulator. States she has to walk a lot for her job and has been continuing to work out.  . Denies any other pedal complaints. Denies n/v/f/c.   Past Medical History:  Diagnosis Date   Achilles tendon rupture 05/2011   left   Acid reflux    OTC as needed   Diabetes mellitus without complication (HCC)    Recurrent upper respiratory infection (URI)    Seasonal allergies     Objective:  Physical Exam: Vascular: DP/PT pulses 2/4 bilateral. CFT <3 seconds. Normal hair growth on digits. No edema.  Skin. No lacerations or abrasions bilateral feet.  Musculoskeletal: MMT 5/5 bilateral lower extremities in DF, PF, Inversion and Eversion. Deceased ROM in DF of ankle joint. Tender dorsally over fourth and fifth metatarsals as well as some tenderness over the ATFL. No pain with ROM.  Neurological: Sensation intact to light touch.   Assessment:   1. Stress fracture of right foot with routine healing, subsequent encounter      Plan:  Patient was evaluated and treated and all questions answered. -Xrays reviewed . Small amount of periosteal reaction noted to proximal diaphysis of 4th and 5th metatarsals.  -Discussed treatement options for stress fracture; risks, alternatives, and benefits explained. -Discussed continuing in the surgical shoe.  -Discussed knee scooter while at work to keep weight off.  -Will seek approval for a bone  stimulator as this has been painful for her for 7 months with no relief.  -Recommend protection, rest, ice, elevation daily until symptoms improve -Rx pain med/antinflammatories as needed -Patient to return to office in 6 weeks for serial x-rays to assess healing  or sooner if condition worsens.    Louann Sjogren, DPM

## 2020-10-25 ENCOUNTER — Other Ambulatory Visit: Payer: Self-pay | Admitting: Podiatry

## 2020-10-25 DIAGNOSIS — M84374G Stress fracture, right foot, subsequent encounter for fracture with delayed healing: Secondary | ICD-10-CM

## 2020-11-29 ENCOUNTER — Telehealth: Payer: Self-pay | Admitting: Sports Medicine

## 2020-11-29 NOTE — Telephone Encounter (Signed)
Patient called requesting extension on handicap placard. With your consent, I will get it filled out for her. Please advise.

## 2021-05-29 ENCOUNTER — Other Ambulatory Visit: Payer: Self-pay | Admitting: Family Medicine

## 2021-05-29 ENCOUNTER — Ambulatory Visit
Admission: RE | Admit: 2021-05-29 | Discharge: 2021-05-29 | Disposition: A | Discharge status: Home / Self Care | Payer: BC Managed Care – PPO | Source: Ambulatory Visit | Attending: Family Medicine | Admitting: Family Medicine

## 2021-05-29 DIAGNOSIS — J410 Simple chronic bronchitis: Secondary | ICD-10-CM

## 2021-06-13 ENCOUNTER — Other Ambulatory Visit: Payer: Self-pay | Admitting: Family Medicine

## 2021-06-13 DIAGNOSIS — Z1231 Encounter for screening mammogram for malignant neoplasm of breast: Secondary | ICD-10-CM

## 2021-07-13 ENCOUNTER — Ambulatory Visit
Admission: RE | Admit: 2021-07-13 | Discharge: 2021-07-13 | Disposition: A | Discharge status: Home / Self Care | Payer: BC Managed Care – PPO | Source: Ambulatory Visit | Attending: Family Medicine | Admitting: Family Medicine

## 2021-07-13 DIAGNOSIS — Z1231 Encounter for screening mammogram for malignant neoplasm of breast: Secondary | ICD-10-CM

## 2021-08-19 ENCOUNTER — Other Ambulatory Visit: Payer: Self-pay | Admitting: Obstetrics and Gynecology

## 2021-09-12 ENCOUNTER — Encounter: Payer: Self-pay | Admitting: Student

## 2021-09-12 ENCOUNTER — Ambulatory Visit: Payer: BC Managed Care – PPO | Admitting: Student

## 2021-09-12 VITALS — BP 128/74 | HR 90 | Temp 98.3°F | Ht 67.0 in | Wt 244.0 lb

## 2021-09-12 DIAGNOSIS — R0609 Other forms of dyspnea: Secondary | ICD-10-CM | POA: Diagnosis not present

## 2021-09-12 DIAGNOSIS — R0683 Snoring: Secondary | ICD-10-CM

## 2021-09-12 MED ORDER — MONTELUKAST SODIUM 10 MG PO TABS: 10.0000 mg | ORAL_TABLET | Freq: Every day | ORAL | 11 refills | Status: DC

## 2021-09-12 NOTE — Patient Instructions (Addendum)
-   We will schedule PFTs. Please hold off on taking any of your LABA/ICS inhalers (controller inhalers - avoid breztri, breo, symbicort). OK to take albuterol, just not on same day as PFTs.  - Would hold off on singulair until PFTs, after PFTs start singulair 10 mg daily - continue allegra, flonase as you're doing - see you in a couple weeks

## 2021-09-12 NOTE — Progress Notes (Signed)
Synopsis: Referred for dyspnea by Jeri Lager, *  Subjective:   PATIENT ID: Shelley Anderson GENDER: female DOB: Jan 30, 1960, MRN: [id number]  Chief Complaint  Patient presents with   Pulmonary Consult    Referred by Derenda Mis, NP. Pt c/o DOE for the past year. She states she gets SOB with walking up a flight of stairs. She states she only feels like she can not breath "on the left side". She has occ cough- non prod.    61yF with history of HTN, GERD, DM, septoplasty/turbinate reduction 10ya, AR followed by ENT, never did have covid-19 infection.  At last ENT visit on FNL had interarytenoid edema  Dyspnea primarily with exertion, talking for long period of time since about 10/2020. Over last year she has started working again with a Psychologist, educational. She feels like she has to stop talking due to feeling like she can't catch her breath 'on the left side only.' She also gets episodes of 'spasmodic' coughing. This only happens when she has a URI. Then it takes consistent use of LABA/ICS inhaler - she has tried several of these, thinks breztri was maybe more effective than others. Takes flonase and allegra also to help with her very persistent AR symptoms with postnasal drainage. Did try AIT years ago but wasn't satisfied with results (>20 ya), saw Dr. Delorse Lek in past. Hasn't tried singulair before.   She takes omeprazole 40 mg about once weekly. Prn pepcid AC. She is anxious about side effects from long term use.   Hasn't recently had course of prednisone for these exacerbations due to concern about adverse effects of prednisone on her metabolic health.   Snores very loud, no PND. She does feel very sleepy during the day.  Otherwise pertinent review of systems is negative.  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 No vaping, MJ. She has lived in [place] when  in [place] in 1980s, otherwise in [place]. She has no pets.      Past Medical History:  Diagnosis Date   Achilles tendon rupture 05/2011   left   Acid reflux    OTC as needed   Diabetes mellitus without complication (HCC)    Recurrent upper respiratory infection (URI)    Seasonal allergies      Family History  Problem Relation Age of Onset   Muscular dystrophy Mother    Breast cancer Mother    Kidney disease Father    Diabetes Father    Breast cancer Maternal Aunt      Past Surgical History:  Procedure Laterality Date   ACHILLES TENDON SURGERY  05/23/2011   Procedure: ACHILLES TENDON REPAIR;  Surgeon: Harvie Junior, MD;  Location: [facility];  Service: Orthopedics;  Laterality: Left;   FOOT SURGERY  20 yrs. ago   to remove bone spurs left foot   GANGLION CYST EXCISION  1990   right wrist   NASAL SEPTUM SURGERY  > 20 yrs. ago   WISDOM TOOTH EXTRACTION      Social History   Socioeconomic History   Marital status: Married    Spouse name: Not on file   Number of children: Not on file   Years of education: Not on file   Highest education level: Not on file  Occupational History   Not on file  Tobacco Use  Smoking status: Former    Packs/day: 0.25    Years: 6.00    Total pack years: 1.50    Types: Cigarettes    Quit date: 01/09/1980    Years since quitting: 41.7   Smokeless tobacco: Never  Vaping Use   Vaping Use: Never used  Substance and Sexual Activity   Alcohol use: Yes    Comment: rarely   Drug use: No   Sexual activity: Not on file  Other Topics Concern   Not on file  Social History Narrative   Not on file   Social Determinants of Health   Financial Resource Strain: Not on file  Food Insecurity: Not on file  Transportation Needs: Not on file  Physical Activity: Not on file  Stress: Not on file  Social Connections: Not on file  Intimate Partner Violence: Not on file     Allergies  Allergen Reactions   Aspirin Nausea And Vomiting    Lactose Intolerance (Gi) Other (See Comments)    GI UPSET     Outpatient Medications Prior to Visit  Medication Sig Dispense Refill   albuterol (ACCUNEB) 1.25 MG/3ML nebulizer solution Take 1 ampule by nebulization every 4 (four) hours as needed.     amLODipine (NORVASC) 5 MG tablet Take 5 mg by mouth daily.  4   fexofenadine (ALLEGRA) 60 MG tablet Take by mouth.     fluticasone (FLONASE) 50 MCG/ACT nasal spray Place 2 sprays into both nostrils daily.     omeprazole (PRILOSEC) 40 MG capsule Take 40 mg by mouth daily as needed.  12   telmisartan-hydrochlorothiazide (MICARDIS HCT) 80-12.5 MG tablet Take 1 tablet by mouth daily.     valACYclovir (VALTREX) 500 MG tablet Take 500 mg by mouth 2 (two) times daily as needed.     budesonide-formoterol (SYMBICORT) 160-4.5 MCG/ACT inhaler Inhale 2 puffs into the lungs 2 (two) times daily as needed. (Patient not taking: Reported on 09/12/2021)     Azelastine HCl 137 MCG/SPRAY SOLN PLACE 2 SPRAYS INTO BOTH NOSTRILS 2 (TWO) TIMES DAILY AS NEEDED FOR RHINITIS. 30 mL 0   fluocinonide ointment (LIDEX) 0.05 % Apply to crown 2-3 times per week. Not to face.     Multiple Vitamin (MULTIVITAMIN) tablet Take 1 tablet by mouth daily.     OZEMPIC, 1 MG/DOSE, 4 MG/3ML SOPN Inject into the skin.     Facility-Administered Medications Prior to Visit  Medication Dose Route Frequency Provider Last Rate Last Admin   triamcinolone acetonide (KENALOG) 10 MG/ML injection 10 mg  10 mg Other Once Asencion Islam, DPM           Objective:   Physical Exam:  General appearance: 60 y.o., female, NAD, conversant  Eyes: anicteric sclerae; PERRL, tracking appropriately HENT: NCAT; MMM Neck: Trachea midline; no lymphadenopathy, no JVD Lungs: CTAB, no crackles, no wheeze, with normal respiratory effort CV: RRR, no murmur  Abdomen: Soft, non-tender; non-distended, BS present  Extremities: No peripheral edema, warm Skin: Normal turgor and texture; no rash Psych:  Appropriate affect Neuro: Alert and oriented to person and place, no focal deficit     Vitals:   09/12/21 1550  BP: 128/74  Pulse: 90  Temp: 98.3 F (36.8 C)  TempSrc: Oral  SpO2: 93%  Weight: 244 lb (110.7 kg)  Height: 5\' 7"  (1.702 m)   93% on RA BMI Readings from Last 3 Encounters:  09/12/21 38.22 kg/m  08/22/18 35.87 kg/m  09/27/14 35.32 kg/m   Wt Readings from Last 3 Encounters:  09/12/21 244 lb (110.7 kg)  08/22/18 229 lb (103.9 kg)  09/27/14 223 lb 12.8 oz (101.5 kg)     CBC    Component Value Date/Time   HGB 13.9 05/23/2011 0920    Chest Imaging: CXR 05/29/21 reviewed by me unremarkable  CT A/P lung bases 2020 reviewed by me with a focus of scar otherwise unremarkable  Pulmonary Functions Testing Results:     No data to display              Assessment & Plan:   # DOE # History of asthma # Arytenoid edema, 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   # Snoring # Excessive daytime sleepiness  Plan: - We will schedule pre/post BD spirometry Please hold off on taking any of your LABA/ICS inhalers (controller inhalers - avoid breztri, breo, symbicort). OK to take albuterol, just not on same day as PFTs.  - Would hold off on singulair until PFTs, after PFTs start singulair 10 mg daily - continue allegra, flonase as you're doing - home sleep study - see you in a couple weeks  I spent 38 minutes dedicated to the care of this patient on the date of this encounter to include pre-visit review of records, face-to-face time with the patient discussing conditions above, post visit ordering of testing, clinical documentation with the electronic health record, making appropriate referrals as documented, and communicating necessary findings to members of the patients care team.     Omar Person, MD [facility] 09/12/2021 3:56 PM

## 2021-09-15 ENCOUNTER — Encounter (HOSPITAL_BASED_OUTPATIENT_CLINIC_OR_DEPARTMENT_OTHER): Payer: Self-pay | Admitting: Obstetrics and Gynecology

## 2021-09-29 ENCOUNTER — Ambulatory Visit (HOSPITAL_BASED_OUTPATIENT_CLINIC_OR_DEPARTMENT_OTHER): Admit: 2021-09-29 | Payer: BC Managed Care – PPO | Admitting: Obstetrics and Gynecology

## 2021-09-29 HISTORY — DX: Dyspnea, unspecified: R06.00

## 2021-09-29 SURGERY — DILATATION & CURETTAGE/HYSTEROSCOPY WITH MYOSURE
Anesthesia: General

## 2021-10-11 ENCOUNTER — Ambulatory Visit (INDEPENDENT_AMBULATORY_CARE_PROVIDER_SITE_OTHER): Payer: BC Managed Care – PPO | Admitting: Student

## 2021-10-11 DIAGNOSIS — R0609 Other forms of dyspnea: Secondary | ICD-10-CM

## 2021-10-11 LAB — PULMONARY FUNCTION TEST
FEF 25-75 Post: 3.08 L/sec
FEF 25-75 Pre: 2.71 L/sec
FEF2575-%Change-Post: 13 %
FEF2575-%Pred-Post: 129 %
FEF2575-%Pred-Pre: 113 %
FEV1-%Change-Post: 1 %
FEV1-%Pred-Post: 77 %
FEV1-%Pred-Pre: 77 %
FEV1-Post: 2.1 L
FEV1-Pre: 2.08 L
FEV1FVC-%Change-Post: 3 %
FEV1FVC-%Pred-Pre: 110 %
FEV6-%Change-Post: -2 %
FEV6-%Pred-Post: 70 %
FEV6-%Pred-Pre: 71 %
FEV6-Post: 2.36 L
FEV6-Pre: 2.42 L
FEV6FVC-%Pred-Post: 103 %
FEV6FVC-%Pred-Pre: 103 %
FVC-%Change-Post: -2 %
FVC-%Pred-Post: 67 %
FVC-%Pred-Pre: 69 %
FVC-Post: 2.37 L
FVC-Pre: 2.42 L
Post FEV1/FVC ratio: 88 %
Post FEV6/FVC ratio: 100 %
Pre FEV1/FVC ratio: 86 %
Pre FEV6/FVC Ratio: 100 %

## 2021-10-11 NOTE — Patient Instructions (Signed)
Pre/Post Spirometry Performed Today. 

## 2021-10-11 NOTE — Progress Notes (Signed)
Pre/Post Spirometry Performed Today. 

## 2021-10-17 ENCOUNTER — Ambulatory Visit: Payer: BC Managed Care – PPO | Admitting: Student

## 2021-11-17 ENCOUNTER — Ambulatory Visit (INDEPENDENT_AMBULATORY_CARE_PROVIDER_SITE_OTHER): Payer: BC Managed Care – PPO | Admitting: Student

## 2021-11-17 ENCOUNTER — Encounter: Payer: Self-pay | Admitting: Student

## 2021-11-17 VITALS — BP 126/78 | HR 129 | Temp 100.9°F | Ht 67.0 in | Wt 244.8 lb

## 2021-11-17 DIAGNOSIS — R509 Fever, unspecified: Secondary | ICD-10-CM | POA: Diagnosis not present

## 2021-11-17 DIAGNOSIS — R053 Chronic cough: Secondary | ICD-10-CM

## 2021-11-17 DIAGNOSIS — U071 COVID-19: Secondary | ICD-10-CM

## 2021-11-17 LAB — POC INFLUENZA A&B (BINAX/QUICKVUE)
Influenza A, POC: NEGATIVE
Influenza B, POC: NEGATIVE

## 2021-11-17 LAB — POC COVID19 BINAXNOW: SARS Coronavirus 2 Ag: POSITIVE — AB

## 2021-11-17 MED ORDER — PSEUDOEPHEDRINE HCL ER 120 MG PO TB12: 120.0000 mg | ORAL_TABLET | Freq: Two times a day (BID) | ORAL | 0 refills | Status: DC

## 2021-11-17 MED ORDER — MOLNUPIRAVIR EUA 200MG CAPSULE: 4.0000 | ORAL_CAPSULE | Freq: Two times a day (BID) | ORAL | 0 refills | Status: AC

## 2021-11-17 MED ORDER — TRELEGY ELLIPTA 200-62.5-25 MCG/ACT IN AEPB: 1.0000 | INHALATION_SPRAY | Freq: Every day | RESPIRATORY_TRACT | Status: DC

## 2021-11-17 NOTE — Progress Notes (Signed)
Synopsis: Referred for dyspnea by Renaye Rakers, MD  Subjective:   PATIENT ID: Shelley Anderson GENDER: female DOB: Jul 14, 1960, MRN: [id number]  Chief Complaint  Patient presents with   Follow-up    Increased cough and PND- clear sputum.    61yF with history of HTN, GERD, DM, septoplasty/turbinate reduction 10ya, AR followed by ENT, never did have covid-19 infection.  At last ENT visit on FNL had interarytenoid edema  Dyspnea primarily with exertion, talking for long period of time since about 10/2020. Over last year she has started working again with a Psychologist, educational. She feels like she has to stop talking due to feeling like she can't catch her breath 'on the left side only.' She also gets episodes of 'spasmodic' coughing. This only happens when she has a URI. Then it takes consistent use of LABA/ICS inhaler - she has tried several of these, thinks breztri was maybe more effective than others. Takes flonase and allegra also to help with her very persistent AR symptoms with postnasal drainage. Did try AIT years ago but wasn't satisfied with results (>20 ya), saw Dr. Delorse Lek in past. Hasn't tried singulair before.   She takes omeprazole 40 mg about once weekly. Prn pepcid AC. She is anxious about side effects from long term use.   Hasn't recently had course of prednisone for these exacerbations due to concern about adverse effects of prednisone on her metabolic health.   Snores very loud, no PND. She does feel very sleepy during the day.  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 No vaping, MJ. She has lived in [place] when in [place] in 1980s, otherwise in [place]. She has no pets.   Interval HPI: Reduced FVC on pre/post spiro but otherwise WNL. Had been off of all inhalers since August.   Unfortunately was working hard at [employer] on fundraising efforts but came down  with covid. Significant sinonasal congestion/drainage over last day and just feels profoundly weak, increased cough, dyspnea.   Otherwise pertinent review of systems is negative.  Past Medical History:  Diagnosis Date   Achilles tendon rupture 05/2011   left   Acid reflux    OTC as needed   Diabetes mellitus without complication (HCC)    Dyspnea on exertion    Recurrent upper respiratory infection (URI)    Seasonal allergies      Family History  Problem Relation Age of Onset   Muscular dystrophy Mother    Breast cancer Mother    Kidney disease Father    Diabetes Father    Breast cancer Maternal Aunt      Past Surgical History:  Procedure Laterality Date   ACHILLES TENDON SURGERY  05/23/2011   Procedure: ACHILLES TENDON REPAIR;  Surgeon: Harvie Junior, MD;  [facility];  Service: Orthopedics;  Laterality: Left;   FOOT SURGERY  20 yrs. ago   to remove bone spurs left foot   GANGLION CYST EXCISION  1990   right wrist   NASAL SEPTUM SURGERY  > 20 yrs. ago   WISDOM TOOTH EXTRACTION      Social History   Socioeconomic History   Marital status: Married    Spouse name: Not on file   Number of children: Not on file   Years of education: Not on file   Highest education level: Not  on file  Occupational History   Not on file  Tobacco Use   Smoking status: Former    Packs/day: 0.25    Years: 6.00    Total pack years: 1.50    Types: Cigarettes    Quit date: 01/09/1980    Years since quitting: 41.8   Smokeless tobacco: Never  Vaping Use   Vaping Use: Never used  Substance and Sexual Activity   Alcohol use: Yes    Comment: rarely   Drug use: No   Sexual activity: Not on file  Other Topics Concern   Not on file  Social History Narrative   Not on file   Social Determinants of Health   Financial Resource Strain: Not on file  Food Insecurity: Not on file  Transportation Needs: Not on file  Physical Activity: Not on file  Stress: Not on file   Social Connections: Not on file  Intimate Partner Violence: Not on file     Allergies  Allergen Reactions   Aspirin Nausea And Vomiting   Lactose Intolerance (Gi) Other (See Comments)    GI UPSET     Outpatient Medications Prior to Visit  Medication Sig Dispense Refill   albuterol (ACCUNEB) 1.25 MG/3ML nebulizer solution Take 1 ampule by nebulization every 4 (four) hours as needed.     amLODipine (NORVASC) 5 MG tablet Take 5 mg by mouth daily.  4   budesonide-formoterol (SYMBICORT) 160-4.5 MCG/ACT inhaler Inhale 2 puffs into the lungs 2 (two) times daily as needed.     fexofenadine (ALLEGRA) 60 MG tablet Take by mouth.     fluticasone (FLONASE) 50 MCG/ACT nasal spray Place 2 sprays into both nostrils daily.     omeprazole (PRILOSEC) 40 MG capsule Take 40 mg by mouth daily as needed.  12   telmisartan-hydrochlorothiazide (MICARDIS HCT) 80-12.5 MG tablet Take 1 tablet by mouth daily.     valACYclovir (VALTREX) 500 MG tablet Take 500 mg by mouth 2 (two) times daily as needed.     montelukast (SINGULAIR) 10 MG tablet Take 1 tablet (10 mg total) by mouth at bedtime. 30 tablet 11   Facility-Administered Medications Prior to Visit  Medication Dose Route Frequency Provider Last Rate Last Admin   triamcinolone acetonide (KENALOG) 10 MG/ML injection 10 mg  10 mg Other Once Asencion Islam, DPM           Objective:   Physical Exam:  General appearance: 61 y.o., female, NAD, conversant  Eyes: anicteric sclerae; PERRL, tracking appropriately HENT: NCAT; MMM Neck: Trachea midline; no lymphadenopathy, no JVD Lungs: CTAB, no crackles, no wheeze, with normal respiratory effort CV: RRR, no murmur  Abdomen: Soft, non-tender; non-distended, BS present  Extremities: No peripheral edema, warm Skin: Normal turgor and texture; no rash Psych: Appropriate affect Neuro: Alert and oriented to person and place, no focal deficit     Vitals:   11/17/21 1554  BP: 126/78  Pulse: (!) 129  Temp:  (!) 100.9 F (38.3 C)  TempSrc: Oral  SpO2: 97%  Weight: 244 lb 12.8 oz (111 kg)  Height: 5\' 7"  (1.702 m)    97% on RA BMI Readings from Last 3 Encounters:  11/17/21 38.34 kg/m  09/12/21 38.22 kg/m  08/22/18 35.87 kg/m   Wt Readings from Last 3 Encounters:  11/17/21 244 lb 12.8 oz (111 kg)  09/12/21 244 lb (110.7 kg)  08/22/18 229 lb (103.9 kg)     CBC    Component Value Date/Time   HGB 13.9 05/23/2011 0920  Chest Imaging: CXR 05/29/21 reviewed by me unremarkable  CT A/P lung bases 2020 reviewed by me with a focus of scar otherwise unremarkable  Pulmonary Functions Testing Results:    Latest Ref Rng & Units 10/11/2021   12:37 PM  PFT Results  FVC-Pre L 2.42   FVC-Predicted Pre % 69   FVC-Post L 2.37   FVC-Predicted Post % 67   Pre FEV1/FVC % % 86   Post FEV1/FCV % % 88   FEV1-Pre L 2.08   FEV1-Predicted Pre % 77   FEV1-Post L 2.10    Pre/post BD spiro 10/11/21 reviewed by me with reduced FVC     Assessment & Plan:   # Covid-19 infection  # DOE # History of asthma # Arytenoid edema, 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  # Reduced vital capacity  # Snoring # Excessive daytime sleepiness  Plan: - molnupiravir  - sudafed for 3-5 days  - would take either breztri or symbicort while you're getting over covid-19 - continue allegra, flonase as you're doing - home sleep study - see you in 8 weeks with TLC, DLCO    Omar Person, MD [facility] 11/17/2021 5:03 PM

## 2021-11-17 NOTE — Patient Instructions (Signed)
-   molnupiravir  - sudafed as needed for up to 3-5 days - would use a steroid containing inhaler like breztri or symbicort for next month or two as you get over covid-19 - PFTs in 8 weeks and clinic visit

## 2021-11-28 ENCOUNTER — Telehealth: Payer: Self-pay | Admitting: Student

## 2021-11-28 NOTE — Telephone Encounter (Signed)
Patient called to say that she will not be able to keep her appt. Today and would like it rescheduled.  CB# [phone]

## 2021-11-28 NOTE — Telephone Encounter (Signed)
I cancelled appt for today & left pt vm to call me back to reschedule.

## 2021-12-04 NOTE — Telephone Encounter (Signed)
Called & left another vm for pt to call me to reschedule hst.

## 2021-12-12 ENCOUNTER — Encounter: Payer: Self-pay | Admitting: Student

## 2021-12-12 NOTE — Telephone Encounter (Signed)
Pt has not called me back.  Sending letter today.  Nothing further needed for message.

## 2022-01-03 ENCOUNTER — Other Ambulatory Visit: Payer: Self-pay | Admitting: Obstetrics and Gynecology

## 2022-01-03 ENCOUNTER — Telehealth: Payer: Self-pay | Admitting: Student

## 2022-01-03 DIAGNOSIS — R9389 Abnormal findings on diagnostic imaging of other specified body structures: Secondary | ICD-10-CM

## 2022-01-03 NOTE — Telephone Encounter (Signed)
Pt called the office. Stated that she was supposed to have had HST last month but had to cancel the appt. Pt is now calling to get this rescheduled. Routing to PCCs.

## 2022-01-03 NOTE — Telephone Encounter (Signed)
Pt goes back to work 1/8 and wanted to see if she could get a machine next week.  No machine available next week but told her I would call her if someone cancels.  If no one cancels next week her husband can pick up machine the following week and drop off for her.  Nothing further needed for this message.

## 2022-01-04 ENCOUNTER — Other Ambulatory Visit: Payer: Self-pay | Admitting: Obstetrics and Gynecology

## 2022-01-11 ENCOUNTER — Other Ambulatory Visit: Payer: BC Managed Care – PPO

## 2022-01-26 ENCOUNTER — Ambulatory Visit: Payer: BC Managed Care – PPO

## 2022-01-26 DIAGNOSIS — R0683 Snoring: Secondary | ICD-10-CM

## 2022-01-26 DIAGNOSIS — G4733 Obstructive sleep apnea (adult) (pediatric): Secondary | ICD-10-CM

## 2022-02-06 DIAGNOSIS — G4733 Obstructive sleep apnea (adult) (pediatric): Secondary | ICD-10-CM | POA: Diagnosis not present

## 2022-02-07 ENCOUNTER — Encounter (HOSPITAL_BASED_OUTPATIENT_CLINIC_OR_DEPARTMENT_OTHER): Payer: Self-pay | Admitting: Obstetrics and Gynecology

## 2022-02-07 ENCOUNTER — Telehealth: Payer: Self-pay | Admitting: Student

## 2022-02-07 DIAGNOSIS — G4733 Obstructive sleep apnea (adult) (pediatric): Secondary | ICD-10-CM

## 2022-02-07 NOTE — Progress Notes (Addendum)
Spoke w/ via phone for pre-op interview--- Shelley Anderson Lab needs dos----ISTAT and EKG per anesthesia. CBC per surgeon.               Lab results------ COVID test -----patient states asymptomatic no test needed Arrive at -------0915 NPO after MN NO Solid Food.  Clear liquids from MN until---0815 Med rec completed Medications to take morning of surgery -----Flonase Diabetic medication ----- Patient instructed no nail polish to be worn day of surgery Patient instructed to bring photo id and insurance card day of surgery Patient aware to have Driver (ride ) / caregiver  [name]  for 24 hours after surgery  Patient Special Instructions ----- Pre-Op special Istructions ----- Patient verbalized understanding of instructions that were given at this phone interview. Patient denies shortness of breath, chest pain, fever, cough at this phone interview.

## 2022-02-07 NOTE — Telephone Encounter (Signed)
PT said someone called her with the results of her sleep study. I do not see any calls to her. Pls call @ [phone]

## 2022-02-08 NOTE — Telephone Encounter (Signed)
Called patient and she states she thought someone called her about her results of sleep study. I did not see any calls or any results yet for sleep study.   Sir can you look at sleep study and let me know the results  Thank you

## 2022-02-08 NOTE — Telephone Encounter (Signed)
Called the pt and there was no answer- LMTCB    

## 2022-02-08 NOTE — Telephone Encounter (Signed)
Mild OSA, oxygen saturation <89% for 45 minutes.   Options of treatment for mild/moderate obstructive sleep apnea will include  1.  CPAP therapy if there is significant daytime sleepiness or other comorbidities including history of CVA or cardiac disease  -If CPAP is chosen as an option of treatment auto titrating CPAP with a pressure setting of 5-15 will be appropriate   2.  Watchful waiting with emphasis on weight loss measures, sleep position modification to optimize lateral sleep, elevating the head of the bed by about 30 degrees may also help.  3.  An oral device may be fashioned for the treatment of mild sleep disordered breathing, will involve referral to dentist.   I would lean more toward trying CPAP for you than the average patient with mild OSA due to the amount of time you spent with oxygen saturation <89%. If you want [place] to order a CPAP for you to try, let us know or if you'd like to discuss in clinic first let us know!!

## 2022-02-09 NOTE — Telephone Encounter (Signed)
Spoke with the pt and notified of results/recs  She was agreeable to CPAP order  Order was placed  Pt to call back to schedule f/u

## 2022-02-15 NOTE — Anesthesia Preprocedure Evaluation (Addendum)
Anesthesia Evaluation  Patient identified by MRN, date of birth, ID band Patient awake    Reviewed: Allergy & Precautions, H&P , NPO status , Patient's Chart, lab work & pertinent test results  Airway Mallampati: I  TM Distance: >3 FB Neck ROM: Full    Dental no notable dental hx. (+) Teeth Intact, Dental Advisory Given   Pulmonary neg pulmonary ROS, former smoker   Pulmonary exam normal breath sounds clear to auscultation       Cardiovascular hypertension, negative cardio ROS Normal cardiovascular exam Rhythm:Regular Rate:Normal     Neuro/Psych  Neuromuscular disease negative neurological ROS  negative psych ROS   GI/Hepatic negative GI ROS, Neg liver ROS,GERD  Medicated and Controlled,,  Endo/Other  negative endocrine ROSdiabetes    Renal/GU negative Renal ROS  negative genitourinary   Musculoskeletal negative musculoskeletal ROS (+)    Abdominal   Peds negative pediatric ROS (+)  Hematology negative hematology ROS (+)   Anesthesia Other Findings ALL: Asa  Reproductive/Obstetrics negative OB ROS                             Anesthesia Physical Anesthesia Plan  ASA: 2  Anesthesia Plan: General   Post-op Pain Management: Toradol IV (intra-op)* and Tylenol PO (pre-op)*   Induction: Intravenous  PONV Risk Score and Plan: 4 or greater and Treatment may vary due to age or medical condition, Midazolam, TIVA, Propofol infusion and Ondansetron  Airway Management Planned: LMA  Additional Equipment: None  Intra-op Plan:   Post-operative Plan:   Informed Consent: I have reviewed the patients History and Physical, chart, labs and discussed the procedure including the risks, benefits and alternatives for the proposed anesthesia with the patient or authorized representative who has indicated his/her understanding and acceptance.     Dental advisory given  Plan Discussed with: CRNA and  Surgeon  Anesthesia Plan Comments:         Anesthesia Quick Evaluation

## 2022-02-16 ENCOUNTER — Encounter (HOSPITAL_BASED_OUTPATIENT_CLINIC_OR_DEPARTMENT_OTHER)
Admission: RE | Disposition: A | Discharge status: Home / Self Care | Payer: Self-pay | Source: Home / Self Care | Attending: Obstetrics and Gynecology

## 2022-02-16 ENCOUNTER — Ambulatory Visit (HOSPITAL_BASED_OUTPATIENT_CLINIC_OR_DEPARTMENT_OTHER): Payer: BC Managed Care – PPO | Admitting: Anesthesiology

## 2022-02-16 ENCOUNTER — Encounter (HOSPITAL_BASED_OUTPATIENT_CLINIC_OR_DEPARTMENT_OTHER): Payer: Self-pay | Admitting: Obstetrics and Gynecology

## 2022-02-16 ENCOUNTER — Other Ambulatory Visit: Payer: Self-pay

## 2022-02-16 ENCOUNTER — Ambulatory Visit (HOSPITAL_BASED_OUTPATIENT_CLINIC_OR_DEPARTMENT_OTHER)
Admission: RE | Admit: 2022-02-16 | Discharge: 2022-02-16 | Disposition: A | Discharge status: Home / Self Care | Payer: BC Managed Care – PPO | Attending: Obstetrics and Gynecology | Admitting: Obstetrics and Gynecology

## 2022-02-16 DIAGNOSIS — K219 Gastro-esophageal reflux disease without esophagitis: Secondary | ICD-10-CM | POA: Insufficient documentation

## 2022-02-16 DIAGNOSIS — N840 Polyp of corpus uteri: Secondary | ICD-10-CM | POA: Insufficient documentation

## 2022-02-16 DIAGNOSIS — I10 Essential (primary) hypertension: Secondary | ICD-10-CM | POA: Diagnosis not present

## 2022-02-16 DIAGNOSIS — Z87891 Personal history of nicotine dependence: Secondary | ICD-10-CM | POA: Insufficient documentation

## 2022-02-16 DIAGNOSIS — N882 Stricture and stenosis of cervix uteri: Secondary | ICD-10-CM | POA: Insufficient documentation

## 2022-02-16 DIAGNOSIS — E119 Type 2 diabetes mellitus without complications: Secondary | ICD-10-CM | POA: Diagnosis not present

## 2022-02-16 DIAGNOSIS — Z833 Family history of diabetes mellitus: Secondary | ICD-10-CM | POA: Insufficient documentation

## 2022-02-16 HISTORY — DX: Essential (primary) hypertension: I10

## 2022-02-16 HISTORY — PX: DILATATION & CURETTAGE/HYSTEROSCOPY WITH MYOSURE: SHX6511

## 2022-02-16 LAB — POCT I-STAT, CHEM 8
BUN: 17 mg/dL (ref 8–23)
Calcium, Ion: 1.12 mmol/L — ABNORMAL LOW (ref 1.15–1.40)
Chloride: 103 mmol/L (ref 98–111)
Creatinine, Ser: 0.8 mg/dL (ref 0.44–1.00)
Glucose, Bld: 119 mg/dL — ABNORMAL HIGH (ref 70–99)
HCT: 43 % (ref 36.0–46.0)
Hemoglobin: 14.6 g/dL (ref 12.0–15.0)
Potassium: 3.8 mmol/L (ref 3.5–5.1)
Sodium: 140 mmol/L (ref 135–145)
TCO2: 27 mmol/L (ref 22–32)

## 2022-02-16 LAB — CBC
HCT: 42.8 % (ref 36.0–46.0)
Hemoglobin: 13.5 g/dL (ref 12.0–15.0)
MCH: 29 pg (ref 26.0–34.0)
MCHC: 31.5 g/dL (ref 30.0–36.0)
MCV: 92 fL (ref 80.0–100.0)
Platelets: 188 10*3/uL (ref 150–400)
RBC: 4.65 MIL/uL (ref 3.87–5.11)
RDW: 13.4 % (ref 11.5–15.5)
WBC: 4.8 10*3/uL (ref 4.0–10.5)
nRBC: 0 % (ref 0.0–0.2)

## 2022-02-16 LAB — GLUCOSE, CAPILLARY: Glucose-Capillary: 138 mg/dL — ABNORMAL HIGH (ref 70–99)

## 2022-02-16 SURGERY — DILATATION & CURETTAGE/HYSTEROSCOPY WITH MYOSURE
Anesthesia: General | Site: Uterus

## 2022-02-16 MED ORDER — DEXAMETHASONE SODIUM PHOSPHATE 10 MG/ML IJ SOLN
INTRAMUSCULAR | Status: AC
  Filled 2022-02-16: qty 1

## 2022-02-16 MED ORDER — OXYCODONE HCL 5 MG/5ML PO SOLN: 5.0000 mg | Freq: Once | ORAL | Status: DC | PRN

## 2022-02-16 MED ORDER — DEXAMETHASONE SODIUM PHOSPHATE 10 MG/ML IJ SOLN
INTRAMUSCULAR | Status: DC | PRN
  Administered 2022-02-16: 5 mg via INTRAVENOUS

## 2022-02-16 MED ORDER — POVIDONE-IODINE 10 % EX SWAB: 2.0000 | Freq: Once | CUTANEOUS | Status: DC

## 2022-02-16 MED ORDER — DEXMEDETOMIDINE HCL IN NACL 80 MCG/20ML IV SOLN
INTRAVENOUS | Status: AC
  Filled 2022-02-16: qty 20

## 2022-02-16 MED ORDER — HYDROMORPHONE HCL 1 MG/ML IJ SOLN: 0.2500 mg | INTRAMUSCULAR | Status: DC | PRN

## 2022-02-16 MED ORDER — PROPOFOL 10 MG/ML IV BOLUS
INTRAVENOUS | Status: AC
  Filled 2022-02-16: qty 20

## 2022-02-16 MED ORDER — OXYCODONE HCL 5 MG PO TABS: 5.0000 mg | ORAL_TABLET | Freq: Once | ORAL | Status: DC | PRN

## 2022-02-16 MED ORDER — MIDAZOLAM HCL 2 MG/2ML IJ SOLN
INTRAMUSCULAR | Status: AC
  Filled 2022-02-16: qty 2

## 2022-02-16 MED ORDER — PROPOFOL 500 MG/50ML IV EMUL
INTRAVENOUS | Status: DC | PRN
  Administered 2022-02-16: 150 ug/kg/min via INTRAVENOUS

## 2022-02-16 MED ORDER — FENTANYL CITRATE (PF) 100 MCG/2ML IJ SOLN
INTRAMUSCULAR | Status: AC
  Filled 2022-02-16: qty 2

## 2022-02-16 MED ORDER — LIDOCAINE 2% (20 MG/ML) 5 ML SYRINGE
INTRAMUSCULAR | Status: DC | PRN
  Administered 2022-02-16: 100 mg via INTRAVENOUS

## 2022-02-16 MED ORDER — KETOROLAC TROMETHAMINE 30 MG/ML IJ SOLN: 30.0000 mg | Freq: Once | INTRAMUSCULAR | Status: DC | PRN

## 2022-02-16 MED ORDER — LIDOCAINE HCL (PF) 2 % IJ SOLN
INTRAMUSCULAR | Status: AC
  Filled 2022-02-16: qty 5

## 2022-02-16 MED ORDER — ONDANSETRON HCL 4 MG/2ML IJ SOLN
INTRAMUSCULAR | Status: DC | PRN
  Administered 2022-02-16: 4 mg via INTRAVENOUS

## 2022-02-16 MED ORDER — PROPOFOL 10 MG/ML IV BOLUS
INTRAVENOUS | Status: DC | PRN
  Administered 2022-02-16: 200 mg via INTRAVENOUS

## 2022-02-16 MED ORDER — ONDANSETRON HCL 4 MG/2ML IJ SOLN
INTRAMUSCULAR | Status: AC
  Filled 2022-02-16: qty 2

## 2022-02-16 MED ORDER — SODIUM CHLORIDE 0.9 % IR SOLN
Status: DC | PRN
  Administered 2022-02-16: 3000 mL

## 2022-02-16 MED ORDER — KETOROLAC TROMETHAMINE 30 MG/ML IJ SOLN
INTRAMUSCULAR | Status: DC | PRN
  Administered 2022-02-16: 30 mg via INTRAVENOUS

## 2022-02-16 MED ORDER — MIDAZOLAM HCL 2 MG/2ML IJ SOLN
INTRAMUSCULAR | Status: DC | PRN
  Administered 2022-02-16: 2 mg via INTRAVENOUS

## 2022-02-16 MED ORDER — ONDANSETRON HCL 4 MG/2ML IJ SOLN: 4.0000 mg | Freq: Once | INTRAMUSCULAR | Status: DC | PRN

## 2022-02-16 MED ORDER — FENTANYL CITRATE (PF) 100 MCG/2ML IJ SOLN
INTRAMUSCULAR | Status: DC | PRN
  Administered 2022-02-16 (×2): 50 ug via INTRAVENOUS

## 2022-02-16 MED ORDER — KETOROLAC TROMETHAMINE 30 MG/ML IJ SOLN
INTRAMUSCULAR | Status: AC
  Filled 2022-02-16: qty 1

## 2022-02-16 MED ORDER — DEXMEDETOMIDINE HCL IN NACL 400 MCG/100ML IV SOLN
INTRAVENOUS | Status: DC | PRN
  Administered 2022-02-16 (×2): 4 ug via INTRAVENOUS
  Administered 2022-02-16: 8 ug via INTRAVENOUS

## 2022-02-16 MED ORDER — LACTATED RINGERS IV SOLN: INTRAVENOUS | Status: DC

## 2022-02-16 SURGICAL SUPPLY — 22 items
BLADE SURG 11 STRL SS (BLADE) IMPLANT
CATH ROBINSON RED A/P 16FR (CATHETERS) IMPLANT
DEVICE MYOSURE REACH (MISCELLANEOUS) IMPLANT
DILATOR CANAL MILEX (MISCELLANEOUS) IMPLANT
DRSG TELFA 3X8 NADH STRL (GAUZE/BANDAGES/DRESSINGS) ×1 IMPLANT
GAUZE 4X4 16PLY ~~LOC~~+RFID DBL (SPONGE) ×1 IMPLANT
GLOVE BIOGEL PI IND STRL 7.0 (GLOVE) ×1 IMPLANT
GLOVE ECLIPSE 6.5 STRL STRAW (GLOVE) ×1 IMPLANT
GOWN STRL REUS W/TWL LRG LVL3 (GOWN DISPOSABLE) ×1 IMPLANT
IV NS IRRIG 3000ML ARTHROMATIC (IV SOLUTION) ×1 IMPLANT
KIT PROCEDURE FLUENT (KITS) ×1 IMPLANT
KIT TURNOVER CYSTO (KITS) ×1 IMPLANT
MYOSURE XL FIBROID (MISCELLANEOUS)
PACK VAGINAL MINOR WOMEN LF (CUSTOM PROCEDURE TRAY) ×1 IMPLANT
PAD OB MATERNITY 4.3X12.25 (PERSONAL CARE ITEMS) ×1 IMPLANT
PAD PREP 24X48 CUFFED NSTRL (MISCELLANEOUS) ×1 IMPLANT
SEAL CERVICAL OMNI LOK (ABLATOR) IMPLANT
SEAL ROD LENS SCOPE MYOSURE (ABLATOR) ×1 IMPLANT
SOL PREP POV-IOD 4OZ 10% (MISCELLANEOUS) IMPLANT
SYSTEM TISS REMOVAL MYOSURE XL (MISCELLANEOUS) IMPLANT
TOWEL OR 17X26 10 PK STRL BLUE (TOWEL DISPOSABLE) ×1 IMPLANT
WATER STERILE IRR 500ML POUR (IV SOLUTION) ×1 IMPLANT

## 2022-02-16 NOTE — Discharge Instructions (Addendum)
CALL  IF TEMP>100.4, NOTHING PER VAGINA X 2 WK, CALL IF SOAKING A MAXI  PAD EVERY HOUR OR MORE FREQUENTLY Post Anesthesia Home Care Instructions  Activity: Get plenty of rest for the remainder of the day. A responsible adult should stay with you for 24 hours following the procedure.  For the next 24 hours, DO NOT: -Drive a car -Paediatric nurse -Drink alcoholic beverages -Take any medication unless instructed by your physician -Make any legal decisions or sign important papers.  Meals: Start with liquid foods such as gelatin or soup. Progress to regular foods as tolerated. Avoid greasy, spicy, heavy foods. If nausea and/or vomiting occur, drink only clear liquids until the nausea and/or vomiting subsides. Call your physician if vomiting continues.  Special Instructions/Symptoms: Your throat may feel dry or sore from the anesthesia or the breathing tube placed in your throat during surgery. If this causes discomfort, gargle with warm salt water. The discomfort should disappear within 24 hours.

## 2022-02-16 NOTE — Anesthesia Procedure Notes (Signed)
Procedure Name: LMA Insertion Date/Time: 02/16/2022 11:17 AM  Performed by: Mechele Claude, CRNAPre-anesthesia Checklist: Patient identified, Emergency Drugs available, Suction available and Patient being monitored Patient Re-evaluated:Patient Re-evaluated prior to induction Oxygen Delivery Method: Circle system utilized Preoxygenation: Pre-oxygenation with 100% oxygen Induction Type: IV induction Ventilation: Mask ventilation without difficulty LMA: LMA inserted LMA Size: 4.0 Number of attempts: 1 Airway Equipment and Method: Bite block Placement Confirmation: positive ETCO2 Tube secured with: Tape Dental Injury: Teeth and Oropharynx as per pre-operative assessment

## 2022-02-16 NOTE — Anesthesia Postprocedure Evaluation (Signed)
Anesthesia Post Note  Patient: Shelley Anderson  Procedure(s) Performed: DILATATION & CURETTAGE/HYSTEROSCOPY WITH MYOSURE (Uterus)     Patient location during evaluation: PACU Anesthesia Type: General Level of consciousness: awake and alert Pain management: pain level controlled Vital Signs Assessment: post-procedure vital signs reviewed and stable Respiratory status: spontaneous breathing, nonlabored ventilation, respiratory function stable and patient connected to nasal cannula oxygen Cardiovascular status: blood pressure returned to baseline and stable Postop Assessment: no apparent nausea or vomiting Anesthetic complications: no  No notable events documented.  Last Vitals:  Vitals:   02/16/22 1241 02/16/22 1304  BP:  (!) 122/91  Pulse: 62 60  Resp: 12 14  Temp: (!) 36.3 C   SpO2: 95% 96%    Last Pain:  Vitals:   02/16/22 1304  TempSrc:   PainSc: 0-No pain                 Barnet Glasgow

## 2022-02-16 NOTE — H&P (Signed)
Shelley Anderson is an 62 y.o. female. G1P0010 MBF presents for surgical mgmt of endometrial mass noted on sonogram and sonohysterogram as part of w/u for endometrial cells on pap smear. Pt has not had any vaginal bleeding. Hx notable for cervical stenosis. Pt is scheduled for dx hysteroscopy, hysteroscopic removal of endometrial  mass , D&C using myosure  Pertinent Gynecological History: Menses: post-menopausal Bleeding: PMP Contraception: none DES exposure: denies Blood transfusions: none Sexually transmitted diseases: no past history Previous GYN Procedures: DNC  Last mammogram: normal Date: 2023 Last pap: abnormal: endometrial cells on pap  Date: 11/2020 OB History: G1, P0010   Menstrual History: Menarche age: n/a No LMP recorded. Patient is postmenopausal.    Past Medical History:  Diagnosis Date   Achilles tendon rupture 05/2011   left   Acid reflux    OTC as needed   Diabetes mellitus without complication (HCC)    Dyspnea on exertion    Hypertension    Recurrent upper respiratory infection (URI)    Seasonal allergies     Past Surgical History:  Procedure Laterality Date   ACHILLES TENDON SURGERY  05/23/2011   Procedure: ACHILLES TENDON REPAIR;  Surgeon: Alta Corning, MD;  Location: [facility];  Service: Orthopedics;  Laterality: Left;   FOOT SURGERY  20 yrs. ago   to remove bone spurs left foot   GANGLION CYST EXCISION  1990   right wrist   NASAL SEPTUM SURGERY  > 20 yrs. ago   WISDOM TOOTH EXTRACTION      Family History  Problem Relation Age of Onset   Muscular dystrophy Mother    Breast cancer Mother    Kidney disease Father    Diabetes Father    Breast cancer Maternal Aunt     Social History:  reports that she quit smoking about 42 years ago. Her smoking use included cigarettes. She has a 1.50 pack-year smoking history. She has never used smokeless tobacco. She reports current alcohol use. She reports that she does not use  drugs.  Allergies:  Allergies  Allergen Reactions   Aspirin Nausea And Vomiting   Lactose Intolerance (Gi) Other (See Comments)    GI UPSET    No medications prior to admission.    Review of Systems  All other systems reviewed and are negative.   Height 5' 6.5" (1.689 m), weight 106.1 kg. Physical Exam Constitutional:      Appearance: Normal appearance. She is obese.  HENT:     Head: Atraumatic.  Eyes:     Extraocular Movements: Extraocular movements intact.  Cardiovascular:     Rate and Rhythm: Regular rhythm.     Heart sounds: Normal heart sounds.  Pulmonary:     Breath sounds: Normal breath sounds.  Abdominal:     Palpations: Abdomen is soft.  Genitourinary:    General: Normal vulva.     Comments: Vagina; atrophic  Cervix stenotic Uterus small av And no palpable mass Neurological:     General: No focal deficit present.     Mental Status: She is alert and oriented to person, place, and time.  Psychiatric:        Mood and Affect: Mood normal.        Behavior: Behavior normal.     No results found for this or any previous visit (from the past 24 hour(s)).  No results found.  Assessment/Plan: Endometrial mass Endometrial cells on pap Postmenopause P) dx hysteroscopy, hysteroscopic resection of endometrial polyp using myosure, dilation  and curettage/ Procedure explained. Risk of surgery reviewed including infection, bleeding, injury to surrounding organ structures, thermal injury fluid overload and its mgmt, uterine perforation( 01/998) and its risk. All ? answered  Andrae Claunch A Amaree Leeper 02/16/2022,

## 2022-02-16 NOTE — Op Note (Signed)
Shelley Anderson, SHARER MEDICAL RECORD NO: [id number] ACCOUNT NO: [id number] DATE OF BIRTH: Apr 14, 1960 FACILITY: [facility] LOCATION: [facility] PHYSICIAN: Mikiyah Glasner A. Garwin Brothers, MD  Operative Report   DATE OF PROCEDURE: 02/16/2022  PREOPERATIVE DIAGNOSES:  Endometrial mass, endometrial cells on Pap, cervical stenosis.  PROCEDURE:  Diagnostic hysteroscopy, hysteroscopic resection of endometrial polyp, dilation and curettage.  POSTOPERATIVE DIAGNOSES:  Endometrial mass, endometrial cells on Pap, cervical stenosis.  ANESTHESIA:  General.  SURGEON:  Pericles Carmicheal A. Garwin Brothers, MD.  ASSISTANT:  None.  DESCRIPTION OF PROCEDURE:  Under adequate general anesthesia, the patient was placed in the dorsal lithotomy position.  She was sterilely prepped and draped in the usual fashion.  The patient had voided prior to entering the room.  Examination under  anesthesia revealed anteflexed uterus, no adnexal masses could be appreciated.  Bivalve speculum was placed in the vagina.  Atrophic vagina was noted.  The cervix was noted to have fused external os. There was a non stellate transverse line where a fusion of the cervical os was thought to be located.  Anterior lip of the cervix was grasped with a single-tooth tenaculum.  The os finder could not open the suspected cervical os location; therefore, a #11 blade was used to cut just at the location of what appears to be a split, in doing so mucoid clear white gelatinous fluid came  out at that point it was felt to be in the right location.  The cervix was then continued to be serially dilated with the [facility] dilators up to #21 [facility] dilator.  Using the diagnostic hysteroscope attempt to traverse the internal os which could be seen proximally was unsuccessful.   The cervix was further dilated and still getting into the uterine cavity was unsuccessful.  Subsequently, a second single-tooth tenaculum was placed on the posterior lip of the cervix to pull on the cervix  itself with a dilation brought up to #23 [facility] dilator.  Finally, the  hysteroscope was introduced into the uterine cavity.  At that point, the cavity was  noted to be distorted.  Both tubal ostia could be seen.  There were some irregular fluid-filled bleb noted on the posterior and anterior and some polypoid area was noted on the left lateral wall.  Using the Reach resectoscope those areas were all resected as was the endometrium and in coming out of the uterine cavity on the left in the endocervical canal, there was a polypoid-appearing lesion with irregular vessels and that was also resected using  the resectoscope. No other lesions noted in the canal.  All instruments were then subsequently removed.  Specimen labeled endometrial curettings with polyp was sent to pathology.  ESTIMATED BLOOD LOSS:  Probably 2 mL.  FLUID DEFICIT:  [id number] mL  COMPLICATIONS:  None.  The patient tolerated the procedure well and was transferred to recovery room in stable condition.   PUS D: 02/16/2022 12:36:50 pm T: 02/16/2022 12:48:00 pm  JOB: [id number]

## 2022-02-16 NOTE — Transfer of Care (Signed)
Immediate Anesthesia Transfer of Care Note  Patient: Stepheni T Matos  Procedure(s) Performed: Procedure(s) (LRB): DILATATION & CURETTAGE/HYSTEROSCOPY WITH MYOSURE (N/A)  Patient Location: PACU  Anesthesia Type: General  Level of Consciousness: awake, alert  and oriented  Airway & Oxygen Therapy: Patient Spontanous Breathing and Patient connected to nasal cannula oxygen, oral airway removed  Post-op Assessment: Report given to PACU RN and Post -op Vital signs reviewed and stable  Post vital signs: Reviewed and stable  Complications: No apparent anesthesia complications  Last Vitals:  Vitals Value Taken Time  BP 127/71 02/16/22 1231  Temp 36.4 C 02/16/22 1214  Pulse 71 02/16/22 1233  Resp 13 02/16/22 1233  SpO2 99 % 02/16/22 1233  Vitals shown include unvalidated device data.  Last Pain:  Vitals:   02/16/22 1214  TempSrc:   PainSc: 0-No pain      Patients Stated Pain Goal: 5 ([id number])  Complications: No notable events documented.

## 2022-02-16 NOTE — Brief Op Note (Signed)
02/16/2022  12:16 PM  PATIENT:  Shelley Anderson  62 y.o. female  PRE-OPERATIVE DIAGNOSIS:  ENDOMETRIAL MASS, ENDOMETRIAL CELLS ON PAP, CERVICAL STENOSIS  POST-OPERATIVE DIAGNOSIS:  ENDOMETRIAL MASS, ENDOMETRIAL CELLS ON PAP; CERVICAL STENOSIS  PROCEDURE:  DIAGNOSTIC HYSTEROSCOPY, HYSTEROSCOPIC RESECTION OF ENDOMETRIAL POLYP, DILATION AND CURETTAGE  SURGEON:  Surgeon(s) and Role:    * Servando Salina, MD - Primary  PHYSICIAN ASSISTANT:   ASSISTANTS: none   ANESTHESIA:   general  EBL:  5 mL   BLOOD ADMINISTERED:none  DRAINS: none   LOCAL MEDICATIONS USED:  NONE  SPECIMEN:  Source of Specimen:  [facility]  DISPOSITION OF SPECIMEN:  PATHOLOGY  COUNTS:  YES  TOURNIQUET:  * No tourniquets in log *  DICTATION: .Other Dictation: Dictation Number [id number]  PLAN OF CARE: Discharge to home after PACU  PATIENT DISPOSITION:  PACU - hemodynamically stable.   Delay start of Pharmacological VTE agent (>24hrs) due to surgical blood loss or risk of bleeding: no

## 2022-02-19 ENCOUNTER — Encounter (HOSPITAL_BASED_OUTPATIENT_CLINIC_OR_DEPARTMENT_OTHER): Payer: Self-pay | Admitting: Obstetrics and Gynecology

## 2022-02-19 LAB — SURGICAL PATHOLOGY

## 2022-02-27 ENCOUNTER — Telehealth: Payer: Self-pay | Admitting: Student

## 2022-02-27 NOTE — Telephone Encounter (Signed)
No one has called her to reach out on the next steps are for the PT. She was supposed to select the mask/nose device that was her preference. I see we have an order in with [facility] but call PT to advise please. [phone]

## 2022-02-28 NOTE — Telephone Encounter (Signed)
Spoke with [employer] they advised cpap machine has just been processed through insurance and she is currently on a list to be called. I have spoke with patient in regards to this and advised to expect a call from [employer] over the next few days. She verbalized understanding. Nothing further needed.

## 2022-04-30 NOTE — Progress Notes (Deleted)
Synopsis: Referred for dyspnea by Renaye Rakers, MD  Subjective:   PATIENT ID: Shelley Anderson GENDER: female DOB: 25-Feb-1960, MRN: [id number]  No chief complaint on file.  62yF with history of HTN, GERD, DM, septoplasty/turbinate reduction 10ya, AR followed by ENT, never did have covid-19 infection.  At last ENT visit on FNL had interarytenoid edema  Dyspnea primarily with exertion, talking for long period of time since about 10/2020. Over last year she has started working again with a Psychologist, educational. She feels like she has to stop talking due to feeling like she can't catch her breath 'on the left side only.' She also gets episodes of 'spasmodic' coughing. This only happens when she has a URI. Then it takes consistent use of LABA/ICS inhaler - she has tried several of these, thinks breztri was maybe more effective than others. Takes flonase and allegra also to help with her very persistent AR symptoms with postnasal drainage. Did try AIT years ago but wasn't satisfied with results (>20 ya), saw Dr. Delorse Lek in past. Hasn't tried singulair before.   She takes omeprazole 40 mg about once weekly. Prn pepcid AC. She is anxious about side effects from long term use.   Hasn't recently had course of prednisone for these exacerbations due to concern about adverse effects of prednisone on her metabolic health.   Snores very loud, no PND. She does feel very sleepy during the day.  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 No vaping, MJ. She has lived in [place] when in [place] in 1980s, otherwise in [place]. She has no pets.   Interval HPI: Reduced FVC on pre/post spiro but otherwise WNL. Had been off of all inhalers since August.   Unfortunately was working hard at [employer] on fundraising efforts but came down with covid. Significant sinonasal congestion/drainage over last day  and just feels profoundly weak, increased cough, dyspnea.  ----------------------------------------------------- Had covid-19 last visit treated with molnupiravir  HST showing mild OSA with AHI of 13, O2 saaturation <89% for 45 min. Autopap ordered.   Otherwise pertinent review of systems is negative.  Past Medical History:  Diagnosis Date   Achilles tendon rupture 05/2011   left   Acid reflux    OTC as needed   Diabetes mellitus without complication (HCC)    Dyspnea on exertion    Hypertension    Recurrent upper respiratory infection (URI)    Seasonal allergies      Family History  Problem Relation Age of Onset   Muscular dystrophy Mother    Breast cancer Mother    Kidney disease Father    Diabetes Father    Breast cancer Maternal Aunt      Past Surgical History:  Procedure Laterality Date   ACHILLES TENDON SURGERY  05/23/2011   Procedure: ACHILLES TENDON REPAIR;  Surgeon: Harvie Junior, MD;  Location: [facility];  Service: Orthopedics;  Laterality: Left;   DILATATION & CURETTAGE/HYSTEROSCOPY WITH MYOSURE N/A 02/16/2022   Procedure: DILATATION & CURETTAGE/HYSTEROSCOPY WITH MYOSURE;  Surgeon: Maxie Better, MD;  Location: [facility];  Service: Gynecology;  Laterality: N/A;   FOOT SURGERY  20 yrs. ago   to remove bone spurs left foot   GANGLION CYST EXCISION  1990   right wrist   NASAL SEPTUM SURGERY  > 20 yrs. ago  WISDOM TOOTH EXTRACTION      Social History   Socioeconomic History   Marital status: Married    Spouse name: Not on file   Number of children: Not on file   Years of education: Not on file   Highest education level: Not on file  Occupational History   Not on file  Tobacco Use   Smoking status: Former    Packs/day: 0.25    Years: 6.00    Additional pack years: 0.00    Total pack years: 1.50    Types: Cigarettes    Quit date: 01/09/1980    Years since quitting: 42.3   Smokeless tobacco: Never  Vaping Use    Vaping Use: Never used  Substance and Sexual Activity   Alcohol use: Yes    Comment: rarely   Drug use: No   Sexual activity: Not on file  Other Topics Concern   Not on file  Social History Narrative   Not on file   Social Determinants of Health   Financial Resource Strain: Not on file  Food Insecurity: Not on file  Transportation Needs: Not on file  Physical Activity: Not on file  Stress: Not on file  Social Connections: Not on file  Intimate Partner Violence: Not on file     Allergies  Allergen Reactions   Aspirin Nausea And Vomiting   Lactose Intolerance (Gi) Other (See Comments)    GI UPSET     Outpatient Medications Prior to Visit  Medication Sig Dispense Refill   albuterol (ACCUNEB) 1.25 MG/3ML nebulizer solution Take 1 ampule by nebulization every 4 (four) hours as needed.     amLODipine (NORVASC) 5 MG tablet Take 5 mg by mouth daily.  4   budesonide-formoterol (SYMBICORT) 160-4.5 MCG/ACT inhaler Inhale 2 puffs into the lungs 2 (two) times daily as needed.     fexofenadine (ALLEGRA) 60 MG tablet Take by mouth.     fluticasone (FLONASE) 50 MCG/ACT nasal spray Place 2 sprays into both nostrils daily.     omeprazole (PRILOSEC) 40 MG capsule Take 40 mg by mouth daily as needed.  12   telmisartan-hydrochlorothiazide (MICARDIS HCT) 80-12.5 MG tablet Take 1 tablet by mouth daily.     valACYclovir (VALTREX) 500 MG tablet Take 500 mg by mouth 2 (two) times daily as needed.     No facility-administered medications prior to visit.       Objective:   Physical Exam:  General appearance: 62 y.o., female, NAD, conversant, female, NAD, conversant  Eyes: anicteric sclerae; PERRL, tracking appropriately HENT: NCAT; MMM Neck: Trachea midline; no lymphadenopathy, no JVD Lungs: CTAB, no crackles, no wheeze, with normal respiratory effort CV: RRR, no murmur  Abdomen: Soft, non-tender; non-distended, BS present  Extremities: No peripheral edema, warm Skin: Normal turgor and texture; no rash Psych:  Appropriate affect Neuro: Alert and oriented to person and place, no focal deficit     There were no vitals filed for this visit.     on RA BMI Readings from Last 3 Encounters:  02/16/22 37.79 kg/m  11/17/21 38.34 kg/m  09/12/21 38.22 kg/m   Wt Readings from Last 3 Encounters:  02/16/22 237 lb 11.2 oz (107.8 kg)  11/17/21 244 lb 12.8 oz (111 kg)  09/12/21 244 lb (110.7 kg)     CBC    Component Value Date/Time   WBC 4.8 02/16/2022 1005   RBC 4.65 02/16/2022 1005   HGB 14.6 02/16/2022 1011   HCT 43.0 02/16/2022 1011   PLT 188 02/16/2022 1005  MCV 92.0 02/16/2022 1005   MCH 29.0 02/16/2022 1005   MCHC 31.5 02/16/2022 1005   RDW 13.4 02/16/2022 1005    Chest Imaging: CXR 05/29/21 reviewed by me unremarkable  CT A/P lung bases 2020 reviewed by me with a focus of scar otherwise unremarkable  Pulmonary Functions Testing Results:    Latest Ref Rng & Units 10/11/2021   12:37 PM  PFT Results  FVC-Pre L 2.42   FVC-Predicted Pre % 69   FVC-Post L 2.37   FVC-Predicted Post % 67   Pre FEV1/FVC % % 86   Post FEV1/FCV % % 88   FEV1-Pre L 2.08   FEV1-Predicted Pre % 77   FEV1-Post L 2.10    Pre/post BD spiro 10/11/21 reviewed by me with reduced FVC     Assessment & Plan:   # Covid-19 infection  # DOE # History of asthma # Arytenoid edema, 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  # Reduced vital capacity  # Snoring # Excessive daytime sleepiness  Plan: - molnupiravir  - sudafed for 3-5 days  - would take either breztri or symbicort while you're getting over covid-19 - continue allegra, flonase as you're doing - home sleep study - see you in 8 weeks with TLC, DLCO    Omar Person, MD [facility] 04/30/2022 7:16 AM

## 2022-05-01 ENCOUNTER — Ambulatory Visit: Payer: BC Managed Care – PPO | Admitting: Student

## 2022-05-01 NOTE — Progress Notes (Signed)
Synopsis: Referred for dyspnea by Renaye Rakers, MD  Subjective:   PATIENT ID: Blenda Nicely GENDER: female DOB: 11-21-1960, MRN: [id number]  Chief Complaint  Patient presents with   Follow-up    Breathing is unchanged since her last visit. No new co's. She is using her breztri rarely.    62yF with history of HTN, GERD, DM, septoplasty/turbinate reduction 10ya, AR followed by ENT, never did have covid-19 infection.  At last ENT visit on FNL had interarytenoid edema  Dyspnea primarily with exertion, talking for long period of time since about 10/2020. Over last year she has started working again with a Psychologist, educational. She feels like she has to stop talking due to feeling like she can't catch her breath 'on the left side only.' She also gets episodes of 'spasmodic' coughing. This only happens when she has a URI. Then it takes consistent use of LABA/ICS inhaler - she has tried several of these, thinks breztri was maybe more effective than others. Takes flonase and allegra also to help with her very persistent AR symptoms with postnasal drainage. Did try AIT years ago but wasn't satisfied with results (>20 ya), saw Dr. Delorse Lek in past. Hasn't tried singulair before.   She takes omeprazole 40 mg about once weekly. Prn pepcid AC. She is anxious about side effects from long term use.   Hasn't recently had course of prednisone for these exacerbations due to concern about adverse effects of prednisone on her metabolic health.   Snores very loud, no PND. She does feel very sleepy during the day.  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 No vaping, MJ. She has lived in [place] when in [place] in 1980s, otherwise in [place]. She has no pets.   Interval HPI:  Had covid-19 last visit treated with molnupiravir  HST showing mild OSA with AHI of 13, O2 saaturation <89% for 45 min.  Autopap ordered.   Right now copious postnasal drainage. Leaning on flonase, allegra. Did trial of breztri for a few days she does think it helped her get through it.   Otherwise pertinent review of systems is negative.  Past Medical History:  Diagnosis Date   Achilles tendon rupture 05/2011   left   Acid reflux    OTC as needed   Diabetes mellitus without complication    Dyspnea on exertion    Hypertension    Recurrent upper respiratory infection (URI)    Seasonal allergies      Family History  Problem Relation Age of Onset   Muscular dystrophy Mother    Breast cancer Mother    Kidney disease Father    Diabetes Father    Breast cancer Maternal Aunt      Past Surgical History:  Procedure Laterality Date   ACHILLES TENDON SURGERY  05/23/2011   Procedure: ACHILLES TENDON REPAIR;  Surgeon: Harvie Junior, MD;  Location: [facility];  Service: Orthopedics;  Laterality: Left;   DILATATION & CURETTAGE/HYSTEROSCOPY WITH MYOSURE N/A 02/16/2022   Procedure: DILATATION & CURETTAGE/HYSTEROSCOPY WITH MYOSURE;  Surgeon: Maxie Better, MD;  Location: [facility];  Service: Gynecology;  Laterality: N/A;   FOOT SURGERY  20 yrs. ago   to remove bone spurs left foot   GANGLION CYST EXCISION  1990   right wrist   NASAL SEPTUM SURGERY  > 20  yrs. ago   WISDOM TOOTH EXTRACTION      Social History   Socioeconomic History   Marital status: Married    Spouse name: Not on file   Number of children: Not on file   Years of education: Not on file   Highest education level: Not on file  Occupational History   Not on file  Tobacco Use   Smoking status: Former    Packs/day: 0.25    Years: 6.00    Additional pack years: 0.00    Total pack years: 1.50    Types: Cigarettes    Quit date: 01/09/1980    Years since quitting: 42.3   Smokeless tobacco: Never  Vaping Use   Vaping Use: Never used  Substance and Sexual Activity   Alcohol use: Yes    Comment: rarely    Drug use: No   Sexual activity: Not on file  Other Topics Concern   Not on file  Social History Narrative   Not on file   Social Determinants of Health   Financial Resource Strain: Not on file  Food Insecurity: Not on file  Transportation Needs: Not on file  Physical Activity: Not on file  Stress: Not on file  Social Connections: Not on file  Intimate Partner Violence: Not on file     Allergies  Allergen Reactions   Aspirin Nausea And Vomiting   Lactose Intolerance (Gi) Other (See Comments)    GI UPSET     Outpatient Medications Prior to Visit  Medication Sig Dispense Refill   Budeson-Glycopyrrol-Formoterol (BREZTRI AEROSPHERE) 160-9-4.8 MCG/ACT AERO Inhale 2 puffs into the lungs in the morning and at bedtime.     albuterol (ACCUNEB) 1.25 MG/3ML nebulizer solution Take 1 ampule by nebulization every 4 (four) hours as needed.     amLODipine (NORVASC) 5 MG tablet Take 5 mg by mouth daily.  4   fexofenadine (ALLEGRA) 60 MG tablet Take by mouth.     fluticasone (FLONASE) 50 MCG/ACT nasal spray Place 2 sprays into both nostrils daily.     omeprazole (PRILOSEC) 40 MG capsule Take 40 mg by mouth daily as needed.  12   telmisartan-hydrochlorothiazide (MICARDIS HCT) 80-12.5 MG tablet Take 1 tablet by mouth daily.     valACYclovir (VALTREX) 500 MG tablet Take 500 mg by mouth 2 (two) times daily as needed.     budesonide-formoterol (SYMBICORT) 160-4.5 MCG/ACT inhaler Inhale 2 puffs into the lungs 2 (two) times daily as needed.     No facility-administered medications prior to visit.       Objective:   Physical Exam:  General appearance: 62 y.o., female, NAD, conversant  Eyes: anicteric sclerae; PERRL, tracking appropriately HENT: NCAT; MMM Neck: Trachea midline; no lymphadenopathy, no JVD Lungs: CTAB, no crackles, no wheeze, with normal respiratory effort CV: RRR, no murmur  Abdomen: Soft, non-tender; non-distended, BS present  Extremities: No peripheral edema,  warm Skin: Normal turgor and texture; no rash Psych: Appropriate affect Neuro: Alert and oriented to person and place, no focal deficit     Vitals:   05/03/22 1521  BP: 134/80  Pulse: 83  Temp: 98.5 F (36.9 C)  TempSrc: Oral  SpO2: 95%  Weight: 233 lb (105.7 kg)  Height: 5' 6.5" (1.689 m)     95% on RA BMI Readings from Last 3 Encounters:  05/03/22 37.04 kg/m  02/16/22 37.79 kg/m  11/17/21 38.34 kg/m   Wt Readings from Last 3 Encounters:  05/03/22 233 lb (105.7 kg)  02/16/22 237 lb  11.2 oz (107.8 kg)  11/17/21 244 lb 12.8 oz (111 kg)     CBC    Component Value Date/Time   WBC 4.8 02/16/2022 1005   RBC 4.65 02/16/2022 1005   HGB 14.6 02/16/2022 1011   HCT 43.0 02/16/2022 1011   PLT 188 02/16/2022 1005   MCV 92.0 02/16/2022 1005   MCH 29.0 02/16/2022 1005   MCHC 31.5 02/16/2022 1005   RDW 13.4 02/16/2022 1005    Chest Imaging: CXR 05/29/21 reviewed by me unremarkable  CT A/P lung bases 2020 reviewed by me with a focus of scar otherwise unremarkable  Pulmonary Functions Testing Results:    Latest Ref Rng & Units 10/11/2021   12:37 PM  PFT Results  FVC-Pre L 2.42   FVC-Predicted Pre % 69   FVC-Post L 2.37   FVC-Predicted Post % 67   Pre FEV1/FVC % % 86   Post FEV1/FCV % % 88   FEV1-Pre L 2.08   FEV1-Predicted Pre % 77   FEV1-Post L 2.10    Pre/post BD spiro 10/11/21 reviewed by me with reduced FVC     Assessment & Plan:   # Acute bronchitis  # CRS, possible AR  # DOE # History of asthma # Arytenoid edema, 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  # Reduced vital capacity  # Positional OSA - mild overall, moderate supine, absent when she is on her side  Plan: - sudafed as needed for up to 3-5 days - change antihistamines - stop allegra and change to zyrtec, claritin, or  xyzal - nasal irrigation with neti pot daily then flonase up to 2 sprays each nostril twice daily for a week, then flonase 1-2 spray each nostril daily - ok to use breztri on intermittent basis for 3-4 days at a time if you need it - body pillows to promote side sleeping or use new cpap mask    Omar Person, MD [facility] 05/03/2022 3:30 PM

## 2022-05-03 ENCOUNTER — Encounter: Payer: Self-pay | Admitting: Student

## 2022-05-03 ENCOUNTER — Ambulatory Visit: Payer: BC Managed Care – PPO | Admitting: Student

## 2022-05-03 VITALS — BP 134/80 | HR 83 | Temp 98.5°F | Ht 66.5 in | Wt 233.0 lb

## 2022-05-03 DIAGNOSIS — J309 Allergic rhinitis, unspecified: Secondary | ICD-10-CM | POA: Diagnosis not present

## 2022-05-03 DIAGNOSIS — R051 Acute cough: Secondary | ICD-10-CM

## 2022-05-03 NOTE — Patient Instructions (Addendum)
-   sudafed as needed for up to 3-5 days - change antihistamines - stop allegra and change to zyrtec, claritin, or xyzal - nasal irrigation with neti pot daily then flonase up to 2 sprays each nostril twice daily for a week, then flonase 1-2 spray each nostril daily - ok to use breztri on intermittent basis for 3-4 days at a time if you need it - body pillows to promote side sleeping or use new cpap mask

## 2022-05-31 ENCOUNTER — Other Ambulatory Visit: Payer: Self-pay | Admitting: Family Medicine

## 2022-05-31 DIAGNOSIS — Z0000 Encounter for general adult medical examination without abnormal findings: Secondary | ICD-10-CM

## 2022-07-16 ENCOUNTER — Ambulatory Visit
Admission: RE | Admit: 2022-07-16 | Discharge: 2022-07-16 | Disposition: A | Discharge status: Home / Self Care | Payer: BC Managed Care – PPO | Source: Ambulatory Visit | Attending: Family Medicine | Admitting: Family Medicine

## 2022-07-16 DIAGNOSIS — Z0000 Encounter for general adult medical examination without abnormal findings: Secondary | ICD-10-CM

## 2022-07-27 ENCOUNTER — Ambulatory Visit (INDEPENDENT_AMBULATORY_CARE_PROVIDER_SITE_OTHER): Payer: BC Managed Care – PPO | Admitting: Podiatry

## 2022-07-27 DIAGNOSIS — L84 Corns and callosities: Secondary | ICD-10-CM | POA: Diagnosis not present

## 2022-07-27 MED ORDER — UREA 10 % EX CREA: TOPICAL_CREAM | CUTANEOUS | 0 refills | Status: AC | PRN

## 2022-07-27 NOTE — Progress Notes (Signed)
  Subjective:  Patient ID: Shelley Anderson, female    DOB: 1960/07/10,  MRN: [id number]  Chief Complaint  Patient presents with   Callouses    painful callus under second toe. Patient is not diabetic.     62 y.o. female presents with the above complaint. History confirmed with patient. Patient presenting with pain related to Callus under the right 2nd toe and several areas on the left foot. Patient is unable to trim own nails related to nail dystrophy and/or mobility issues. Patient does not have a history of T2DM. Patient does/does not have callus present located at the plantar aspect of the 2nd toe on right foot as well as sub 4th and 5th metatarsal head on the left foot causing pain. States the right 2nd toe callus started from [employer] inserts.   Objective:  Physical Exam: warm, good capillary refill nail exam normal nails without lesions DP pulses palpable, PT pulses palpable, and protective sensation intact Left Foot:  Painful hyperkeratotic lesion sub 4th and 5th met head left foot Right Foot: Pain with palpation hyperkeratotic lesion sub 2nd digit on the right foot  Assessment:   1. Callus of foot      Plan:  Patient was evaluated and treated and all questions answered.  #Hyperkeratotic lesions/pre ulcerative calluses present sub R 2nd toe, Left 4th and 5th met head plantar  All symptomatic hyperkeratoses x 3 separate lesions were safely debrided with a sterile #10 blade to patient's level of comfort without incident. We discussed preventative and palliative care of these lesions including supportive and accommodative shoegear, padding, prefabricated and custom molded accommodative orthoses, use of a pumice stone and lotions/creams daily. -e Rx for urea 10% lotion  Return in about 3 months (around 10/27/2022) for Callus trim.         Corinna Gab, DPM [facility] / [facility]

## 2022-10-19 ENCOUNTER — Ambulatory Visit: Payer: BC Managed Care – PPO | Admitting: Podiatry

## 2022-10-19 DIAGNOSIS — L84 Corns and callosities: Secondary | ICD-10-CM | POA: Diagnosis not present

## 2022-10-19 MED ORDER — UREA 10 % EX CREA: TOPICAL_CREAM | CUTANEOUS | 0 refills | Status: AC | PRN

## 2022-10-19 NOTE — Progress Notes (Addendum)
  Subjective:  Patient ID: Shelley Anderson, female    DOB: 08/22/1960,  MRN: [id number]   62 y.o. female presents with calluses. History confirmed with patient. Patient presenting with pain related to Callus under the right 2nd toe and several areas on the left foot. Patient is unable to trim own nails related to nail dystrophy and/or mobility issues. Patient does not have a history of T2DM. Patient does/does not have callus present located at the plantar aspect of the 2nd toe on right foot as well as sub 4th and 5th metatarsal head on the left foot causing pain. States the right 2nd toe callus started from [employer] inserts.   Objective:  Physical Exam: warm, good capillary refill nail exam normal nails without lesions DP pulses palpable, PT pulses palpable, and protective sensation intact Left Foot:  Painful hyperkeratotic lesion sub 4th and 5th met head left foot Right Foot: Pain with palpation hyperkeratotic lesion sub 2nd digit on the right foot  Assessment:   1. Callus of foot      Plan:  Patient was evaluated and treated and all questions answered.  #Hyperkeratotic lesions/pre ulcerative calluses present sub R 2nd toe, Left 4th and 5th met head plantar  All symptomatic hyperkeratoses x 3 separate lesions were safely debrided with a sterile #10 blade to patient's level of comfort without incident. We discussed preventative and palliative care of these lesions including supportive and accommodative shoegear, padding, prefabricated and custom molded accommodative orthoses, use of a pumice stone and lotions/creams daily. -e Rx for urea 10% lotion  Return if symptoms worsen or fail to improve.         Corinna Gab, DPM [facility] / [facility]

## 2022-10-19 NOTE — Addendum Note (Signed)
Addended by: Carlena Hurl F on: 10/19/2022 01:20 PM   Modules accepted: Orders, Level of Service

## 2022-12-20 ENCOUNTER — Ambulatory Visit: Payer: BC Managed Care – PPO | Admitting: Primary Care

## 2022-12-20 ENCOUNTER — Encounter: Payer: Self-pay | Admitting: Primary Care

## 2022-12-20 VITALS — BP 108/64 | HR 85 | Temp 98.0°F | Ht 67.0 in | Wt 213.2 lb

## 2022-12-20 DIAGNOSIS — R0683 Snoring: Secondary | ICD-10-CM | POA: Diagnosis not present

## 2022-12-20 DIAGNOSIS — G4733 Obstructive sleep apnea (adult) (pediatric): Secondary | ICD-10-CM | POA: Diagnosis not present

## 2022-12-20 DIAGNOSIS — R053 Chronic cough: Secondary | ICD-10-CM

## 2022-12-20 MED ORDER — BREZTRI AEROSPHERE 160-9-4.8 MCG/ACT IN AERO: 2.0000 | INHALATION_SPRAY | Freq: Two times a day (BID) | RESPIRATORY_TRACT | Status: AC

## 2022-12-20 NOTE — Patient Instructions (Signed)
-  INTERMITTENT ASTHMA: Intermittent asthma means you experience asthma symptoms only occasionally, often triggered by respiratory illnesses. We will try a daily use of your Breztri inhaler for 10-14 days to see if it helps with your deep breath discomfort and cough.  -SHORTNESS OF BREATH: Shortness of breath can be caused by various factors. Since your previous pulmonary function tests were normal, we will order a CT scan of your chest to get a clearer picture of what might be causing your symptoms.  -OBSTRUCTIVE SLEEP APNEA: Obstructive sleep apnea is a condition where your airway becomes blocked during sleep, causing snoring and interrupted sleep. We will order a home sleep study to assess your current status and help [place] prescribe a new CPAP machine if needed.  -GASTROESOPHAGEAL REFLUX DISEASE: Gastroesophageal reflux disease (GERD) is when stomach acid frequently flows back into the tube connecting your mouth and stomach. Continue taking your current medications, Prilosec and Pepcid as needed, to manage your symptoms.  -ALLERGIC RHINITIS: Allergic rhinitis is an allergic reaction that causes sneezing, congestion, and a runny nose. Continue using Allegra and Fluticasone as you have been to manage your symptoms.  INSTRUCTIONS:  Please follow up with the following: 1. Use your Breztri inhaler daily for 10-14 days and monitor your symptoms. 2. Schedule and complete the CT scan of your chest. 3. Arrange for a home sleep study to evaluate your sleep apnea. 4. Continue your current medications for GERD and allergic rhinitis. 5. Schedule a follow-up appointment to discuss the results and next steps.

## 2022-12-20 NOTE — Progress Notes (Signed)
@Patient  ID: Shelley Anderson, female    DOB: 08-Jan-1961, 62 y.o.   MRN: [id number]  Chief Complaint  Patient presents with   Follow-up    C/o sob-same, denies cough/wheeze    Referring provider: Renaye Rakers, MD  HPI: 89 year, former smoker.  Medical history significant for chronic cough, asthma, suspected irritable larynx, PND, GERD, allergic rhinitis, positional OSA.   12/20/2022 Discussed the use of AI scribe software for clinical note transcription with the patient, who gave verbal consent to proceed.  History of Present Illness   The patient, with a history of intermittent asthma-like symptoms, sleep apnea, and allergic rhinitis, presents with persistent shortness of breath, particularly on the left side. She describes the sensation as a blockage between the nasal passage and the lung, which is exacerbated during respiratory illnesses. The patient reports that deep, diaphragmatic breaths trigger a cough. This coughing response to deep breaths has limited her ability to engage in physical activities, such as walking up stairs or giving tours of engineering buildings.  The patient has been managing her allergic rhinitis with daily Allegra and Fluticasone, which she finds difficult to do without, especially during allergy season. She also has a history of a septoplasty and turbinate reduction over ten years ago, and a small posterior septal perforation was noted on a recent nasal endoscopy. The patient is also on medication for reflux, which she supplements with Pepcid as needed.  The patient has a Breztri inhaler, which she uses approximately once a year when her cough becomes severe. She reports that the inhaler is effective in these instances.     The patient has been off her CPAP therapy for sleep apnea and is experiencing severe snoring, nocturia, and dry mouth due to sleeping with her mouth open. She reports inadequate sleep duration, which varies due to her night owl  tendencies.  Allergies  Allergen Reactions   Aspirin Nausea And Vomiting   Lactose Intolerance (Gi) Other (See Comments)    GI UPSET    Immunization History  Administered Date(s) Administered   Moderna SARS-COV2 Booster Vaccination 11/30/2019   Moderna Sars-Covid-2 Vaccination 03/19/2019, 04/16/2019    Past Medical History:  Diagnosis Date   Achilles tendon rupture 05/2011   left   Acid reflux    OTC as needed   Diabetes mellitus without complication (HCC)    Dyspnea on exertion    Hypertension    Recurrent upper respiratory infection (URI)    Seasonal allergies     Tobacco History: Social History   Tobacco Use  Smoking Status Former   Current packs/day: 0.00   Average packs/day: 0.3 packs/day for 6.0 years (1.5 ttl pk-yrs)   Types: Cigarettes   Start date: 01/08/1974   Quit date: 01/09/1980   Years since quitting: 42.9  Smokeless Tobacco Never   Counseling given: Not Answered   Outpatient Medications Prior to Visit  Medication Sig Dispense Refill   amLODipine (NORVASC) 5 MG tablet Take 5 mg by mouth daily.  4   Budeson-Glycopyrrol-Formoterol (BREZTRI AEROSPHERE) 160-9-4.8 MCG/ACT AERO Inhale 2 puffs into the lungs in the morning and at bedtime.     fexofenadine (ALLEGRA) 60 MG tablet Take by mouth.     fluticasone (FLONASE) 50 MCG/ACT nasal spray Place 2 sprays into both nostrils daily.     omeprazole (PRILOSEC) 40 MG capsule Take 40 mg by mouth daily as needed.  12   telmisartan-hydrochlorothiazide (MICARDIS HCT) 80-12.5 MG tablet Take 1 tablet by mouth daily.  urea (CARMOL) 10 % cream Apply topically as needed. 71 g 0   valACYclovir (VALTREX) 500 MG tablet Take 500 mg by mouth 2 (two) times daily as needed.     albuterol (ACCUNEB) 1.25 MG/3ML nebulizer solution Take 1 ampule by nebulization every 4 (four) hours as needed. (Patient not taking: Reported on 12/20/2022)     urea (CARMOL) 10 % cream Apply topically as needed. 71 g 0   No facility-administered  medications prior to visit.    Review of Systems  Review of Systems  Constitutional: Negative.   HENT: Negative.    Respiratory:  Positive for cough. Negative for shortness of breath and wheezing.   Cardiovascular: Negative.    Physical Exam  BP 108/64 (BP Location: Right Arm, Cuff Size: Large)   Pulse 85   Temp 98 F (36.7 C) (Temporal)   Ht 5\' 7"  (1.702 m)   Wt 213 lb 3.2 oz (96.7 kg)   SpO2 98%   BMI 33.39 kg/m  Physical Exam Constitutional:      General: She is not in acute distress.    Appearance: Normal appearance. She is not ill-appearing.  HENT:     Head: Normocephalic and atraumatic.  Cardiovascular:     Rate and Rhythm: Normal rate and regular rhythm.  Pulmonary:     Effort: Pulmonary effort is normal.     Breath sounds: Normal breath sounds. No wheezing, rhonchi or rales.  Skin:    General: Skin is warm and dry.  Neurological:     General: No focal deficit present.     Mental Status: She is alert and oriented to person, place, and time. Mental status is at baseline.  Psychiatric:        Mood and Affect: Mood normal.        Behavior: Behavior normal.        Thought Content: Thought content normal.        Judgment: Judgment normal.      Lab Results:  CBC    Component Value Date/Time   WBC 4.8 02/16/2022 1005   RBC 4.65 02/16/2022 1005   HGB 14.6 02/16/2022 1011   HCT 43.0 02/16/2022 1011   PLT 188 02/16/2022 1005   MCV 92.0 02/16/2022 1005   MCH 29.0 02/16/2022 1005   MCHC 31.5 02/16/2022 1005   RDW 13.4 02/16/2022 1005    BMET    Component Value Date/Time   NA 140 02/16/2022 1011   K 3.8 02/16/2022 1011   CL 103 02/16/2022 1011   GLUCOSE 119 (H) 02/16/2022 1011   BUN 17 02/16/2022 1011   CREATININE 0.80 02/16/2022 1011    BNP No results found for: "BNP"  ProBNP No results found for: "PROBNP"  Imaging: No results found.   Assessment & Plan:   1. Chronic cough (Primary) - CT CHEST HIGH RESOLUTION; Future  2. Snoring -  Home sleep test; Future  3. OSA (obstructive sleep apnea) - Home sleep test; Future     Intermittent Asthma Symptoms only during respiratory illnesses. Infrequent use of Breztri inhaler, which provides relief when used. Trial of consistent Breztri use twice daily for 10-14 days to assess if it improves deep breath discomfort and cough.  Chronic cough  Previous pulmonary function tests normal. No advanced imaging of chest performed yet. Order CT scan of chest to further evaluate.  Obstructive Sleep Apnea Not currently using CPAP. Reports snoring, dry mouth, and nocturia. Sleep duration varies due to work schedule. Order home sleep study to assess  current status of sleep apnea and facilitate new CPAP prescription.  Gastroesophageal Reflux Disease On Prilosec, with occasional use of Pepcid for immediate relief. Continue current management.  Allergic Rhinitis Managed with Allegra and Fluticasone. Continue current management.      Glenford Bayley, NP 12/20/2022

## 2022-12-28 ENCOUNTER — Ambulatory Visit (HOSPITAL_COMMUNITY)
Admission: RE | Admit: 2022-12-28 | Discharge: 2022-12-28 | Disposition: A | Discharge status: Home / Self Care | Payer: BC Managed Care – PPO | Source: Ambulatory Visit | Attending: Primary Care | Admitting: Primary Care

## 2022-12-28 DIAGNOSIS — R053 Chronic cough: Secondary | ICD-10-CM | POA: Diagnosis present

## 2023-05-08 ENCOUNTER — Ambulatory Visit: Payer: Self-pay | Admitting: Podiatrist

## 2023-05-08 ENCOUNTER — Ambulatory Visit (INDEPENDENT_AMBULATORY_CARE_PROVIDER_SITE_OTHER)

## 2023-05-08 DIAGNOSIS — S92515A Nondisplaced fracture of proximal phalanx of left lesser toe(s), initial encounter for closed fracture: Secondary | ICD-10-CM

## 2023-05-08 DIAGNOSIS — M778 Other enthesopathies, not elsewhere classified: Secondary | ICD-10-CM

## 2023-05-08 NOTE — Progress Notes (Signed)
 Chief Complaint  Patient presents with   Toe Injury    Pt.stubbed left foot 4th toe on Friday. 3 pain. 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      HPI: Patient is 63 y.o. female who presents today for pain in the left foot especially the left fourth toe. She hit her toe against a door jam and felt pain in the toe. She has had redness and pain of the toe itself as well as brusing across all the toes 2-5 left foot.    BROKEN FOURTH TOE LEFT.  CALLUS SUB HALLUX AND SECOND TOE RIGHT.   There are no active problems to display for this patient.   Current Outpatient Medications on File Prior to Visit  Medication Sig Dispense Refill   amLODipine  (NORVASC ) 5 MG tablet Take 5 mg by mouth daily.  4   Budeson-Glycopyrrol-Formoterol (BREZTRI  AEROSPHERE) 160-9-4.8 MCG/ACT AERO Inhale 2 puffs into the lungs in the morning and at bedtime.     Budeson-Glycopyrrol-Formoterol (BREZTRI  AEROSPHERE) 160-9-4.8 MCG/ACT AERO Inhale 2 puffs into the lungs in the morning and at bedtime.     fexofenadine (ALLEGRA) 60 MG tablet Take by mouth.     fluticasone (FLONASE) 50 MCG/ACT nasal spray Place 2 sprays into both nostrils daily.     omeprazole (PRILOSEC) 40 MG capsule Take 40 mg by mouth daily as needed.  12   telmisartan-hydrochlorothiazide (MICARDIS HCT) 80-12.5 MG tablet Take 1 tablet by mouth daily.     valACYclovir (VALTREX) 500 MG tablet Take 500 mg by mouth 2 (two) times daily as needed.     albuterol (ACCUNEB) 1.25 MG/3ML nebulizer solution Take 1 ampule by nebulization every 4 (four) hours as needed. (Patient not taking: Reported on 05/08/2023)     urea  (CARMOL) 10 % cream Apply topically as needed. (Patient not taking: Reported on 05/08/2023) 71 g 0   urea  (CARMOL) 10 % cream Apply topically as needed. (Patient not taking: Reported on 05/08/2023) 71 g 0   No current facility-administered medications on file prior to visit.    Allergies  Allergen Reactions   Aspirin Nausea And Vomiting   Lactose Intolerance (Gi) Other (See  Comments)    GI UPSET    Review of Systems No fevers, chills, nausea, muscle aches, no difficulty breathing, no calf pain, no chest pain or shortness of breath.   Physical Exam  GENERAL APPEARANCE: Alert, conversant. Appropriately groomed. No acute distress.   VASCULAR: Pedal pulses palpable 2/4 DP and 2/4 PT bilateral.  Capillary refill time is immediate to all digits,  Proximal to distal cooling is warm to warm.  Digital perfusion adequate.   NEUROLOGIC: sensation is intact to 5.07 monofilament at 5/5 sites bilateral.  Light touch is intact bilateral, vibratory sensation intact bilateral  MUSCULOSKELETAL: acceptable muscle strength, tone and stability bilateral. Pain left fourth toe noted.swelling and some redness in the toe as well.  Bruising across the toes seen as well.   DERMATOLOGIC: callus sub hallux and second toe right.  Skin intact post debridement-  otherwise skin is warm, supple, and dry.  Color, texture, and turgor of skin within normal limits.  No open wounds are noted.  No preulcerative lesions are seen.  Digital nails are asymptomatic.    Xray- left foot-  fracture is seen distal medial aspect of the proximal phalanx of the fourth toe.  Good alignment is maintained.    Assessment     ICD-10-CM   1. Closed nondisplaced fracture of proximal phalanx of lesser toe of left foot, initial  encounter  S92.515A DG Foot Complete Left       Plan  Discussed exam and xray findings. Discussed she has a small fracture of the toe and it should go on to heal uneventfully.  Surgical shoe dispensed and she will wear until the toe is no longer tender.  This should resolve in 4 weeks or sooner.  If problems persist past this time she will call.  Calluses also debrided as a courtesy today.

## 2023-06-24 ENCOUNTER — Other Ambulatory Visit: Payer: Self-pay | Admitting: Family Medicine

## 2023-06-24 DIAGNOSIS — Z1231 Encounter for screening mammogram for malignant neoplasm of breast: Secondary | ICD-10-CM

## 2023-07-17 ENCOUNTER — Ambulatory Visit
Admission: RE | Admit: 2023-07-17 | Discharge: 2023-07-17 | Disposition: A | Discharge status: Home / Self Care | Payer: Self-pay | Source: Ambulatory Visit | Attending: Family Medicine | Admitting: Family Medicine

## 2023-07-17 DIAGNOSIS — Z1231 Encounter for screening mammogram for malignant neoplasm of breast: Secondary | ICD-10-CM
